# Patient Record
Sex: Female | Born: 2000
Health system: Southern US, Community
[De-identification: ages and names within clinical notes are randomized; demographics above are authoritative.]

## PROBLEM LIST (undated history)

## (undated) DIAGNOSIS — C9100 Acute lymphoblastic leukemia not having achieved remission: Secondary | ICD-10-CM

## (undated) DIAGNOSIS — M1990 Unspecified osteoarthritis, unspecified site: Secondary | ICD-10-CM

## (undated) HISTORY — DX: Acute lymphoblastic leukemia not having achieved remission: C91.00

---
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
## 2022-02-08 ENCOUNTER — Other Ambulatory Visit (HOSPITAL_COMMUNITY): Payer: Self-pay

## 2022-02-12 ENCOUNTER — Other Ambulatory Visit (HOSPITAL_COMMUNITY): Payer: Self-pay

## 2022-02-16 ENCOUNTER — Encounter: Payer: Self-pay | Admitting: Internal Medicine

## 2022-02-23 ENCOUNTER — Ambulatory Visit (INDEPENDENT_AMBULATORY_CARE_PROVIDER_SITE_OTHER): Payer: No Typology Code available for payment source | Admitting: Internal Medicine

## 2022-02-23 ENCOUNTER — Other Ambulatory Visit: Payer: Self-pay

## 2022-02-23 ENCOUNTER — Encounter: Payer: Self-pay | Admitting: Internal Medicine

## 2022-02-23 VITALS — BP 100/60 | HR 80 | Resp 15 | Ht 62.0 in | Wt 167.4 lb

## 2022-02-23 DIAGNOSIS — R21 Rash and other nonspecific skin eruption: Secondary | ICD-10-CM

## 2022-02-23 DIAGNOSIS — L299 Pruritus, unspecified: Secondary | ICD-10-CM

## 2022-02-23 MED ORDER — TRIAMCINOLONE ACETONIDE 0.1 % EX CREA
1.0000 "application " | TOPICAL_CREAM | Freq: Two times a day (BID) | CUTANEOUS | 0 refills | Status: DC
  Filled 2022-02-23: qty 30, 15d supply, fill #0

## 2022-02-23 NOTE — Patient Instructions (Signed)
Rash, Adult  A rash is a change in the color of your skin. A rash can also change the way your skin feels. There are many different conditions and factors that can cause a rash. Follow these instructions at home: The goal of treatment is to stop the itching and keep the rash from spreading. Watch for any changes in your symptoms. Let your doctor know about them. Follow these instructions to help with your condition: Medicine Take or apply over-the-counter and prescription medicines only as told by your doctor. These may include medicines: To treat red or swollen skin (corticosteroid creams). To treat itching. To treat an allergy (oral antihistamines). To treat very bad symptoms (oral corticosteroids).  Skin care Put cool cloths (compresses) on the affected areas. Do not scratch or rub your skin. Avoid covering the rash. Make sure that the rash is exposed to air as much as possible. Managing itching and discomfort Avoid hot showers or baths. These can make itching worse. A cold shower may help. Try taking a bath with: Epsom salts. You can get these at your local pharmacy or grocery store. Follow the instructions on the package. Baking soda. Pour a small amount into the bath as told by your doctor. Colloidal oatmeal. You can get this at your local pharmacy or grocery store. Follow the instructions on the package. Try putting baking soda paste onto your skin. Stir water into baking soda until it gets like a paste. Try putting on a lotion that relieves itchiness (calamine lotion). Keep cool and out of the sun. Sweating and being hot can make itching worse. General instructions  Rest as needed. Drink enough fluid to keep your pee (urine) pale yellow. Wear loose-fitting clothing. Avoid scented soaps, detergents, and perfumes. Use gentle soaps, detergents, perfumes, and other cosmetic products. Avoid anything that causes your rash. Keep a journal to help track what causes your rash. Write  down: What you eat. What cosmetic products you use. What you drink. What you wear. This includes jewelry. Keep all follow-up visits as told by your doctor. This is important. Contact a doctor if: You sweat at night. You lose weight. You pee (urinate) more than normal. You pee less than normal, or you notice that your pee is a darker color than normal. You feel weak. You throw up (vomit). Your skin or the whites of your eyes look yellow (jaundice). Your skin: Tingles. Is numb. Your rash: Does not go away after a few days. Gets worse. You are: More thirsty than normal. More tired than normal. You have: New symptoms. Pain in your belly (abdomen). A fever. Watery poop (diarrhea). Get help right away if: You have a fever and your symptoms suddenly get worse. You start to feel mixed up (confused). You have a very bad headache or a stiff neck. You have very bad joint pains or stiffness. You have jerky movements that you cannot control (seizure). Your rash covers all or most of your body. The rash may or may not be painful. You have blisters that: Are on top of the rash. Grow larger. Grow together. Are painful. Are inside your nose or mouth. You have a rash that: Looks like purple pinprick-sized spots all over your body. Has a "bull's eye" or looks like a target. Is red and painful, causes your skin to peel, and is not from being in the sun too long. Summary A rash is a change in the color of your skin. A rash can also change the way your skin   feels. The goal of treatment is to stop the itching and keep the rash from spreading. Take or apply over-the-counter and prescription medicines only as told by your doctor. Contact a doctor if you have new symptoms or symptoms that get worse. Keep all follow-up visits as told by your doctor. This is important. This information is not intended to replace advice given to you by your health care provider. Make sure you discuss any  questions you have with your health care provider. Document Revised: 07/30/2021 Document Reviewed: 07/30/2021 Elsevier Patient Education  2023 Elsevier Inc.  

## 2022-02-23 NOTE — Progress Notes (Signed)
? ?Subjective:  ? ? Patient ID: Robin Harris, female    DOB: 2001/03/27, 21 y.o.   MRN: [id number] ? ?HPI ? ?Patient presents to clinic today with complaint of a rash on her hands.  This started about 4 months ago.  The rash is located at the base of her palms extending down to her wrist.  The rash is scaly and itchy.  It has not seemed to spread.  She is not allergic to anything that she is aware of.  No one in her home has a similar rash.  She denies recent changes in soaps, lotions, detergents or medications.  She was seen 12/23/2021 for the same, prescribed Prednisone and advised to take Claritin daily.  She reports the Prednisone did not help and she never started taking Claritin.  She called dermatology for an appointment but could not be seen until September. ? ?Review of Systems ? ?   ?Past Medical History:  ?Diagnosis Date  ? 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 semiannually sees Dr. Girtha Rm [facility]), in remission ==> rec yearly CBC, CMP, CPE, and echo Q94yr - [facility]see [facility] dated [facility]  ? Arthritis   ? pt states she does not have arthritis  ? ? ?Current Outpatient Medications  ?Medication Sig Dispense Refill  ? norethindrone-ethinyl estradiol-FE (JUNEL FE 1/20) 1-20 MG-MCG tablet Take 1 tablet by mouth daily. 28 tablet 11  ? predniSONE (DELTASONE) 10 MG tablet Take 6 tabs on day 1, 5 tabs on day 2, 4 tabs on day 3, 3 tabs on day 4, 2 tabs on day 5, 1 tab on day 6 21 tablet 0  ? ?No current facility-administered medications for this visit.  ? ? ?Allergies  ?Allergen Reactions  ? Sulfamethoxazole-Trimethoprim Rash  ? ? ?Family History  ?Problem Relation Age of Onset  ? Diabetes Maternal Grandmother   ? Hyperlipidemia Maternal Grandmother   ? Coronary artery disease Neg Hx   ? Stroke Neg Hx   ? ? ?Social History  ? ?Socioeconomic History  ? Marital status: Single  ?  Spouse name: Not on file  ? Number of children: Not on file  ? Years of education: Not on file  ? Highest  education level: Not on file  ?Occupational History  ? Not on file  ?Tobacco Use  ? Smoking status: Never  ? Smokeless tobacco: Never  ?Vaping Use  ? Vaping Use: Never used  ?Substance and Sexual Activity  ? Alcohol use: No  ? Drug use: No  ? Sexual activity: Never  ?Other Topics Concern  ? Not on file  ?Social History Narrative  ? Lives with mom and sister ([name].  No pets.  ? No smokers at home.  ? ?Social Determinants of Health  ? ?Financial Resource Strain: Not on file  ?Food Insecurity: Not on file  ?Transportation Needs: Not on file  ?Physical Activity: Not on file  ?Stress: Not on file  ?Social Connections: Not on file  ?Intimate Partner Violence: Not on file  ? ? ? ?Constitutional: Denies fever, malaise, fatigue, headache or abrupt weight changes.  ?Respiratory: Denies difficulty breathing, shortness of breath, cough or sputum production.   ?Cardiovascular: Denies chest pain, chest tightness, palpitations or swelling in the hands or feet.  ?Skin: Patient reports rash of hands/wrists.  Denies redness, or ulcercations.  ? ?No other specific complaints in a complete review of systems (except as listed in HPI above). ? ?Objective:  ? Physical Exam ? ? ?  BP 100/60 (BP Location: Left Arm, Patient Position: Sitting, Cuff Size: Normal)   Pulse 80   Resp 15   Ht '5\' 2"'$  (1.575 m)   Wt 167 lb 6.4 oz (75.9 kg)   SpO2 98%   BMI 30.62 kg/m?  ? ?Wt Readings from Last 3 Encounters:  ?12/23/21 163 lb (73.9 kg) (91 %, Z= 1.33)*  ?10/27/21 163 lb (73.9 kg) (91 %, Z= 1.34)*  ?05/21/21 175 lb 12.8 oz (79.7 kg) (95 %, Z= 1.63)*  ? ?* Growth percentiles are based on CDC (Girls, 2-20 Years) data.  ? ? ?General: Appears her stated age, obese, in NAD. ?Skin: Grouped papular, scaly and excoriated lesions noted of the base of the palmar surface of the palms extending down to the mid forearms. ?Cardiovascular: Normal rate and rhythm.  ?Pulmonary/Chest: Normal effort and positive vesicular breath sounds. No respiratory  distress. ?Musculoskeletal: No joint swelling noted of the wrist.  No difficulty with gait.  ?Neurological: Alert and oriented.  ? ? ? ? ? ? ?BMET ?   ?Component Value Date/Time  ? NA 139 10/27/2021 1120  ? K 4.6 10/27/2021 1120  ? CL 104 10/27/2021 1120  ? CO2 28 10/27/2021 1120  ? GLUCOSE 90 10/27/2021 1120  ? BUN 11 10/27/2021 1120  ? CREATININE 0.83 10/27/2021 1120  ? CALCIUM 9.5 10/27/2021 1120  ? ? ?Lipid Panel  ?No results found for: CHOL, TRIG, HDL, CHOLHDL, VLDL, LDLCALC ? ?CBC ?   ?Component Value Date/Time  ? WBC 3.9 (L) 10/27/2021 1120  ? RBC 4.53 10/27/2021 1120  ? HGB 14.5 10/27/2021 1120  ? HCT 43.7 10/27/2021 1120  ? PLT 181 10/27/2021 1120  ? MCV 96.5 10/27/2021 1120  ? MCH 32.0 10/27/2021 1120  ? MCHC 33.2 10/27/2021 1120  ? RDW 12.7 10/27/2021 1120  ? LYMPHSABS 944 (L) 10/27/2021 1120  ? MONOABS 0.5 11/14/2017 1418  ? EOSABS 230 10/27/2021 1120  ? BASOSABS 20 10/27/2021 1120  ? ? ?Hgb A1C ?No results found for: HGBA1C ? ? ? ? ?   ?Assessment & Plan:  ? ?Rash of Hands/Wrists: ? ?Advised her to please start taking the Claritin daily ?Referral to dermatology placed, will consult [place] MD in the meantime ?Referral to allergy placed for allergy testing ?Rx for Triamcinolone cream 0.1% twice daily as needed ? ?Schedule an appointment for your annual exam ?Webb Silversmith, NP ? ?

## 2022-03-21 ENCOUNTER — Ambulatory Visit
Admission: EM | Admit: 2022-03-21 | Discharge: 2022-03-21 | Disposition: A | Discharge status: Home / Self Care | Payer: No Typology Code available for payment source | Attending: Emergency Medicine | Admitting: Emergency Medicine

## 2022-03-21 DIAGNOSIS — J020 Streptococcal pharyngitis: Secondary | ICD-10-CM

## 2022-03-21 LAB — POCT RAPID STREP A (OFFICE): Rapid Strep A Screen: POSITIVE — AB

## 2022-03-21 MED ORDER — AMOXICILLIN 500 MG PO CAPS: 500.0000 mg | ORAL_CAPSULE | Freq: Two times a day (BID) | ORAL | 0 refills | Status: AC

## 2022-03-21 NOTE — ED Triage Notes (Signed)
Patient presents to Urgent Care with complaints of sore throat and nasal congestion x 1 day. Treating symptoms with allergy and tylenol with no improvement.

## 2022-03-21 NOTE — ED Provider Notes (Signed)
Roderic Palau    CSN: [id number] Arrival date & time: 03/21/22  0840      History   Chief Complaint Chief Complaint  Patient presents with   Sore Throat    Sinus Congestion - Entered by patient    HPI Robin Harris is a 21 y.o. female.  Accompanied by her mother, patient presents with sore throat, nasal congestion, postnasal drip x1 day.  No fever, chills, rash, cough, shortness of breath, vomiting, diarrhea, or other symptoms.  Treatment at home with Tylenol and OTC allergy medication; last taken yesterday.  No OTC medications today.  Her medical history includes acute lymphoblastic leukemia.  The history is provided by the patient and medical records.   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 Girtha Rm [facility]), in remission ==> rec yearly CBC, CMP, CPE, and echo Q73yr - [facility]   Arthritis    pt states she does not have arthritis    Patient Active Problem List   Diagnosis Date Noted   History of acute lymphoblastic leukemia (ALL) 10/27/2021    Past Surgical History:  Procedure Laterality Date   MYRINGOTOMY  2005   Bilateral   PORT-A-CATH REMOVAL  2010   PORTACATH PLACEMENT  2007   for chemo   TUMOR REMOVAL  2008   benign; behind left ear    OB History   No obstetric history on file.      Home Medications    Prior to Admission medications   Medication Sig Start Date End Date Taking? Authorizing Provider  amoxicillin (AMOXIL) 500 MG capsule Take 1 capsule (500 mg total) by mouth 2 (two) times daily for 10 days. 03/21/22 03/31/22 Yes TSharion Balloon NP  norethindrone-ethinyl estradiol-FE (JUNEL FE 1/20) 1-20 MG-MCG tablet Take 1 tablet by mouth daily. 10/27/21   BJearld Fenton NP  triamcinolone cream (KENALOG) 0.1 % Apply 1 application. topically 2 (two) times daily. 02/23/22   BJearld Fenton NP  FLUoxetine (PROZAC) 10 MG capsule Take 1  capsule (10 mg total) by mouth daily. 06/11/20 01/23/21  BJearld Fenton NP    Family History Family History  Problem Relation Age of Onset   Diabetes Maternal Grandmother    Hyperlipidemia Maternal Grandmother    Coronary artery disease Neg Hx    Stroke Neg Hx     Social History Social History   Tobacco Use   Smoking status: Never   Smokeless tobacco: Never  Vaping Use   Vaping Use: Never used  Substance Use Topics   Alcohol use: No   Drug use: No     Allergies   Sulfamethoxazole-trimethoprim   Review of Systems Review of Systems  Constitutional:  Negative for chills and fever.  HENT:  Positive for congestion, postnasal drip and sore throat. Negative for ear pain.   Respiratory:  Negative for cough and shortness of breath.   Gastrointestinal:  Negative for diarrhea and vomiting.  Skin:  Negative for color change and rash.  All other systems reviewed and are negative.   Physical Exam Triage Vital Signs ED Triage Vitals  Enc Vitals Group     BP      Pulse      Resp      Temp      Temp src      SpO2      Weight  Height      Head Circumference      Peak Flow      Pain Score      Pain Loc      Pain Edu?      Excl. in [place]?    No data found.  Updated Vital Signs BP 122/83   Pulse 83   Temp 98.2 F (36.8 C)   Resp 18   LMP 02/26/2022   SpO2 100%   Visual Acuity Right Eye Distance:   Left Eye Distance:   Bilateral Distance:    Right Eye Near:   Left Eye Near:    Bilateral Near:     Physical Exam Vitals and nursing note reviewed.  Constitutional:      General: She is not in acute distress.    Appearance: Normal appearance. She is well-developed. She is not ill-appearing.  HENT:     Right Ear: Tympanic membrane normal.     Left Ear: Tympanic membrane normal.     Nose: Nose normal.     Mouth/Throat:     Mouth: Mucous membranes are moist.     Pharynx: Posterior oropharyngeal erythema present.  Cardiovascular:     Rate and Rhythm:  Normal rate and regular rhythm.     Heart sounds: Normal heart sounds.  Pulmonary:     Effort: Pulmonary effort is normal. No respiratory distress.     Breath sounds: Normal breath sounds.  Musculoskeletal:     Cervical back: Neck supple.  Skin:    General: Skin is warm and dry.  Neurological:     Mental Status: She is alert.  Psychiatric:        Mood and Affect: Mood normal.        Behavior: Behavior normal.     UC Treatments / Results  Labs (all labs ordered are listed, but only abnormal results are displayed) Labs Reviewed  POCT RAPID STREP A (OFFICE) - Abnormal; Notable for the following components:      Result Value   Rapid Strep A Screen Positive (*)    All other components within normal limits    EKG   Radiology No results found.  Procedures Procedures (including critical care time)  Medications Ordered in UC Medications - No data to display  Initial Impression / Assessment and Plan / UC Course  I have reviewed the triage vital signs and the nursing notes.  Pertinent labs & imaging results that were available during my care of the patient were reviewed by me and considered in my medical decision making (see chart for details).    Strep pharyngitis.  Rapid strep positive.  Treating with amoxicillin.  Discussed symptomatic treatment including Tylenol or ibuprofen as needed.  Instructed patient to follow-up with her PCP if her symptoms are not improving.  She agrees to plan of care.  Final Clinical Impressions(s) / UC Diagnoses   Final diagnoses:  Strep pharyngitis     Discharge Instructions      Take the amoxicillin as directed for strep throat.   Follow up with your primary care provider if your symptoms are not improving.        ED Prescriptions     Medication Sig Dispense Auth. Provider   amoxicillin (AMOXIL) 500 MG capsule Take 1 capsule (500 mg total) by mouth 2 (two) times daily for 10 days. 20 capsule Sharion Balloon, NP      PDMP not  reviewed this encounter.   Sharion Balloon, NP 03/21/22 [phone]

## 2022-03-21 NOTE — Discharge Instructions (Addendum)
Take the amoxicillin as directed for strep throat.    Follow up with your primary care provider if your symptoms are not improving.    

## 2022-05-17 ENCOUNTER — Other Ambulatory Visit (HOSPITAL_COMMUNITY): Payer: Self-pay

## 2022-05-25 ENCOUNTER — Encounter: Payer: Self-pay | Admitting: Internal Medicine

## 2022-06-16 ENCOUNTER — Other Ambulatory Visit (HOSPITAL_COMMUNITY): Payer: Self-pay

## 2022-07-09 ENCOUNTER — Other Ambulatory Visit (HOSPITAL_COMMUNITY): Payer: Self-pay

## 2022-07-16 ENCOUNTER — Encounter: Payer: Self-pay | Admitting: Internal Medicine

## 2022-07-19 ENCOUNTER — Ambulatory Visit (INDEPENDENT_AMBULATORY_CARE_PROVIDER_SITE_OTHER): Payer: No Typology Code available for payment source | Admitting: Physician Assistant

## 2022-07-19 ENCOUNTER — Encounter: Payer: Self-pay | Admitting: Physician Assistant

## 2022-07-19 VITALS — BP 131/85 | HR 86 | Temp 98.5°F | Ht 62.0 in | Wt 166.4 lb

## 2022-07-19 DIAGNOSIS — J029 Acute pharyngitis, unspecified: Secondary | ICD-10-CM

## 2022-07-19 NOTE — Progress Notes (Signed)
Acute Office Visit   Patient: Robin Harris   DOB: 2001-10-01   21 y.o. Female  MRN: [id number] Visit Date: 07/19/2022  Today's healthcare provider: Dani Gobble Yaser Harvill, PA-C  Introduced myself to the patient as a Journalist, newspaper and provided education on APPs in clinical practice.    Chief Complaint  Patient presents with   Sore Throat   Subjective    Sore Throat  Associated symptoms include congestion. Pertinent negatives include no coughing, diarrhea, ear pain, headaches, shortness of breath or vomiting.     States this started Sat Reports sore throat, stuffy nose with drainage She reports that her mother and sister have been feeling under the weather as well She has not tested herself for COVID at home Interventions: cold and flu and ibruprofen  Medications: Outpatient Medications Prior to Visit  Medication Sig   norethindrone-ethinyl estradiol-FE (JUNEL FE 1/20) 1-20 MG-MCG tablet Take 1 tablet by mouth daily.   triamcinolone cream (KENALOG) 0.1 % Apply 1 application. topically 2 (two) times daily.   No facility-administered medications prior to visit.    Review of Systems  Constitutional:  Positive for fatigue. Negative for chills, diaphoresis and fever.  HENT:  Positive for congestion, sinus pressure and sore throat. Negative for ear pain and rhinorrhea.   Respiratory:  Negative for cough, shortness of breath and wheezing.   Gastrointestinal:  Negative for diarrhea, nausea and vomiting.  Musculoskeletal:  Negative for myalgias.  Neurological:  Negative for dizziness, light-headedness and headaches.       Objective    BP 131/85   Pulse 86   Temp 98.5 F (36.9 C) (Oral)   Ht '5\' 2"'$  (1.575 m)   Wt 166 lb 6.4 oz (75.5 kg)   SpO2 100%   BMI 30.43 kg/m    Physical Exam Vitals reviewed.  Constitutional:      General: She is awake.     Appearance: Normal appearance. She is well-developed, well-groomed and normal weight.  HENT:     Head: Normocephalic and  atraumatic.     Right Ear: Ear canal normal.     Left Ear: Tympanic membrane and ear canal normal.     Nose: No congestion.     Mouth/Throat:     Mouth: Mucous membranes are moist. No oral lesions.     Dentition: Normal dentition.     Pharynx: Uvula midline. Posterior oropharyngeal erythema present. No pharyngeal swelling, oropharyngeal exudate or uvula swelling.     Tonsils: No tonsillar exudate or tonsillar abscesses.     Comments: Mild redness in posterior oropharynx  Eyes:     Conjunctiva/sclera: Conjunctivae normal.     Pupils: Pupils are equal, round, and reactive to light.  Cardiovascular:     Rate and Rhythm: Normal rate and regular rhythm.     Heart sounds: Normal heart sounds. No murmur heard.    No friction rub. No gallop.  Pulmonary:     Effort: Pulmonary effort is normal. No respiratory distress.     Breath sounds: Normal breath sounds. No wheezing, rhonchi or rales.  Musculoskeletal:     Cervical back: Normal range of motion and neck supple. No edema or erythema. Normal range of motion.  Lymphadenopathy:     Head:     Right side of head: No submental, submandibular or preauricular adenopathy.     Left side of head: No submental, submandibular or preauricular adenopathy.     Upper Body:     Right  upper body: No supraclavicular adenopathy.     Left upper body: No supraclavicular adenopathy.  Neurological:     Mental Status: She is alert.  Psychiatric:        Behavior: Behavior is cooperative.       No results found for any visits on 07/19/22.  Assessment & Plan      No follow-ups on file.      Problem List Items Addressed This Visit   None Visit Diagnoses     Sore throat    -  Primary Acute, new concern Reports sore throat, nasal congestion, maxillary sinus pressure Rapid strep was negative - Centor criteria score of 1 so will not send for culture at this time Recommend symptomatic relief as mainstay of management- can use lozenges, throat numbing  spray, ibuprofen, tylenol as desired Offered to test for COVID, flu, RSV but patient declined Follow up as needed - discussed return precautions and she voiced understanding         No follow-ups on file.   I, Mishika Flippen E Mosetta Ferdinand, PA-C, have reviewed all documentation for this visit. The documentation on 07/19/22 for the exam, diagnosis, procedures, and orders are all accurate and complete.   Talitha Givens, MHS, PA-C [facility]

## 2022-07-19 NOTE — Progress Notes (Deleted)
          Acute Office Visit   Patient: Robin Harris   DOB: May 10, 2001   21 y.o. Female  MRN: [id number] Visit Date: 07/19/2022  Today's healthcare provider: Dani Gobble Calianne Larue, PA-C  Introduced myself to the patient as a Journalist, newspaper and provided education on APPs in clinical practice.    Chief Complaint  Patient presents with   Sore Throat   Subjective    HPI    Medications: Outpatient Medications Prior to Visit  Medication Sig   norethindrone-ethinyl estradiol-FE (JUNEL FE 1/20) 1-20 MG-MCG tablet Take 1 tablet by mouth daily.   triamcinolone cream (KENALOG) 0.1 % Apply 1 application. topically 2 (two) times daily.   No facility-administered medications prior to visit.    Review of Systems  {Labs  Heme  Chem  Endocrine  Serology  Results Review (optional):23779}   Objective    BP 131/85   Pulse 86   Temp 98.5 F (36.9 C) (Oral)   Ht '5\' 2"'$  (1.575 m)   Wt 166 lb 6.4 oz (75.5 kg)   SpO2 100%   BMI 30.43 kg/m  {Show previous vital signs (optional):23777}  Physical Exam    No results found for any visits on 07/19/22.  Assessment & Plan      No follow-ups on file.

## 2022-07-26 ENCOUNTER — Other Ambulatory Visit: Payer: Self-pay

## 2022-07-26 ENCOUNTER — Telehealth: Payer: No Typology Code available for payment source | Admitting: Physician Assistant

## 2022-07-26 DIAGNOSIS — L0100 Impetigo, unspecified: Secondary | ICD-10-CM | POA: Diagnosis not present

## 2022-07-26 MED ORDER — MUPIROCIN 2 % EX OINT
1.0000 | TOPICAL_OINTMENT | Freq: Two times a day (BID) | CUTANEOUS | 0 refills | Status: DC
  Filled 2022-07-26: qty 22, 10d supply, fill #0

## 2022-07-26 NOTE — Progress Notes (Signed)
E Visit for Rash  We are sorry that you are not feeling well. Here is how we plan to help!  Based upon what you have shared with me it looks like you have a bacterial infection called Impetigo. Impetigo is a contagious skin infection that usually affects children between the ages of two and five years, but it can also affect older children and adults. It can develop if bacteria get into healthy skin or into minor cuts, scrapes, or any other small openings (such as those caused by bug bites). The infection usually occurs in warm, humid conditions and is easily spread among people in close contact, particularly in crowded living conditions.  Impetigo is usually caused by a bacterium called "Staphylococcus aureus," a type of "staph" infection. Less commonly, the infection is caused by either another bacterium called streptococcus group A, or "strep," or by both bacteria.   I have prescribed: and Topical mupirocin Apply to affected area twice daily for 7 days.  HOME CARE:  Take cool showers and avoid direct sunlight. Apply cool compress or wet dressings. Take a bath in an oatmeal bath.  Sprinkle content of one Aveeno packet under running faucet with comfortably warm water.  Bathe for 15-20 minutes, 1-2 times daily.  Pat dry with a towel. Do not rub the rash. Use hydrocortisone cream. Take an antihistamine like Benadryl for widespread rashes that itch.  The adult dose of Benadryl is 25-50 mg by mouth 4 times daily. Caution:  This type of medication may cause sleepiness.  Do not drink alcohol, drive, or operate dangerous machinery while taking antihistamines.  Do not take these medications if you have prostate enlargement.  Read package instructions thoroughly on all medications that you take.  GET HELP RIGHT AWAY IF:  Symptoms don't go away after treatment. Severe itching that persists. If you rash spreads or swells. If you rash begins to smell. If it blisters and opens or develops a yellow-brown  crust. You develop a fever. You have a sore throat. You become short of breath.  MAKE SURE YOU:  Understand these instructions. Will watch your condition. Will get help right away if you are not doing well or get worse.  Thank you for choosing an e-visit.  Your e-visit answers were reviewed by a board certified advanced clinical practitioner to complete your personal care plan. Depending upon the condition, your plan could have included both over the counter or prescription medications.  Please review your pharmacy choice. Make sure the pharmacy is open so you can pick up prescription now. If there is a problem, you may contact your provider through [employer] and have the prescription routed to another pharmacy.  Your safety is important to [place]. If you have drug allergies check your prescription carefully.   For the next 24 hours you can use MyChart to ask questions about today's visit, request a non-urgent call back, or ask for a work or school excuse. You will get an email in the next two days asking about your experience. I hope that your e-visit has been valuable and will speed your recovery.  I provided 5 minutes of non face-to-face time during this encounter for chart review and documentation.

## 2022-08-09 ENCOUNTER — Other Ambulatory Visit (HOSPITAL_COMMUNITY): Payer: Self-pay

## 2022-08-11 ENCOUNTER — Ambulatory Visit: Payer: No Typology Code available for payment source | Admitting: Dermatology

## 2022-08-11 ENCOUNTER — Other Ambulatory Visit: Payer: Self-pay

## 2022-08-11 DIAGNOSIS — L509 Urticaria, unspecified: Secondary | ICD-10-CM

## 2022-08-11 DIAGNOSIS — L858 Other specified epidermal thickening: Secondary | ICD-10-CM

## 2022-08-11 DIAGNOSIS — L209 Atopic dermatitis, unspecified: Secondary | ICD-10-CM

## 2022-08-11 MED ORDER — CLOBETASOL PROPIONATE 0.05 % EX CREA
1.0000 | TOPICAL_CREAM | CUTANEOUS | 2 refills | Status: AC
  Filled 2022-08-11: qty 60, 30d supply, fill #0
  Filled 2023-08-08: qty 60, 30d supply, fill #1

## 2022-08-11 NOTE — Patient Instructions (Addendum)
For Hives Start Claritin or Allegra 1 to 4 daily as needed for hives.  Start out with one a day,  then if not improving may take 2 a day, then if not improving on 2 may take 3 a day, then if still not improving amy take up to 4 a day as needed for hives    Due to recent changes in healthcare laws, you may see results of your pathology and/or laboratory studies on MyChart before the doctors have had a chance to review them. We understand that in some cases there may be results that are confusing or concerning to you. Please understand that not all results are received at the same time and often the doctors may need to interpret multiple results in order to provide you with the best plan of care or course of treatment. Therefore, we ask that you please give [place] 2 business days to thoroughly review all your results before contacting the office for clarification. Should we see a critical lab result, you will be contacted sooner.   If You Need Anything After Your Visit  If you have any questions or concerns for your doctor, please call our main line at [phone] and press option 4 to reach your doctor's medical assistant. If no one answers, please leave a voicemail as directed and we will return your call as soon as possible. Messages left after 4 pm will be answered the following business day.   You may also send [place] a message via [place]. We typically respond to MyChart messages within 1-2 business days.  For prescription refills, please ask your pharmacy to contact our office. Our fax number is [phone].  If you have an urgent issue when the clinic is closed that cannot wait until the next business day, you can page your doctor at the number below.    Please note that while we do our best to be available for urgent issues outside of office hours, we are not available 24/7.   If you have an urgent issue and are unable to reach [place], you may choose to seek medical care at your doctor's office,  retail clinic, urgent care center, or emergency room.  If you have a medical emergency, please immediately call 911 or go to the emergency department.  Pager Numbers  - Dr. Nehemiah Massed: [phone]  - Dr. Laurence Ferrari: [phone]  - Dr. Nicole Kindred: [phone]  In the event of inclement weather, please call our main line at [phone] for an update on the status of any delays or closures.  Dermatology Medication Tips: Please keep the boxes that topical medications come in in order to help keep track of the instructions about where and how to use these. Pharmacies typically print the medication instructions only on the boxes and not directly on the medication tubes.   If your medication is too expensive, please contact our office at [phone] option 4 or send [place] a message through [place].   We are unable to tell what your co-pay for medications will be in advance as this is different depending on your insurance coverage. However, we may be able to find a substitute medication at lower cost or fill out paperwork to get insurance to cover a needed medication.   If a prior authorization is required to get your medication covered by your insurance company, please allow [place] 1-2 business days to complete this process.  Drug prices often vary depending on where the prescription is filled and some pharmacies may offer cheaper prices.  The website www.goodrx.com contains coupons for medications through different pharmacies. The prices here do not account for what the cost may be with help from insurance (it may be cheaper with your insurance), but the website can give you the price if you did not use any insurance.  - You can print the associated coupon and take it with your prescription to the pharmacy.  - You may also stop by our office during regular business hours and pick up a GoodRx coupon card.  - If you need your prescription sent electronically to a different pharmacy, notify our office through  [facility] or by phone at [phone] option 4.     Si Usted Necesita Algo Despus de Su Visita  Tambin puede enviarnos un mensaje a travs de Pharmacist, community. Por lo general respondemos a los mensajes de MyChart en el transcurso de 1 a 2 das hbiles.  Para renovar recetas, por favor pida a su farmacia que se ponga en contacto con nuestra oficina. Harland Dingwall de fax es [place] [phone].  Si tiene un asunto urgente cuando la clnica est cerrada y que no puede esperar hasta el siguiente da hbil, puede llamar/localizar a su doctor(a) al nmero que aparece a continuacin.   Por favor, tenga en cuenta que aunque hacemos todo lo posible para estar disponibles para asuntos urgentes fuera del horario de [place], no estamos disponibles las 24 horas del da, los 7 das de la [place].   Si tiene un problema urgente y no puede comunicarse con nosotros, puede optar por buscar atencin mdica  en el consultorio de su doctor(a), en una clnica privada, en un centro de atencin urgente o en una sala de emergencias.  Si tiene Engineering geologist, por favor llame inmediatamente al 911 o vaya a la sala de emergencias.  Nmeros de bper  - Dr. Nehemiah Massed: [phone]  - Dra. Moye: [phone]  - Dra. Nicole Kindred: [phone]  En caso de inclemencias del [place], por favor llame a Johnsie Kindred principal al [phone] para una actualizacin sobre el [place] de cualquier retraso o cierre.  Consejos para la medicacin en dermatologa: Por favor, guarde las cajas en las que vienen los medicamentos de uso tpico para ayudarle a seguir las instrucciones sobre dnde y cmo usarlos. Las farmacias generalmente imprimen las instrucciones del medicamento slo en las cajas y no directamente en los tubos del [place].   Si su medicamento es muy caro, por favor, pngase en contacto con Zigmund Daniel llamando al [phone] y presione la opcin 4 o envenos un mensaje a travs de Pharmacist, community.   No podemos  decirle cul ser su copago por los medicamentos por adelantado ya que esto es diferente dependiendo de la cobertura de su seguro. Sin embargo, es posible que podamos encontrar un medicamento sustituto a Electrical engineer un formulario para que el seguro cubra el medicamento que se considera necesario.   Si se requiere una autorizacin previa para que su compaa de seguros [place] su medicamento, por favor permtanos de 1 a 2 das hbiles para completar este proceso.  Los precios de los medicamentos varan con frecuencia dependiendo del Environmental consultant de dnde se surte la receta y alguna farmacias pueden ofrecer precios ms baratos.  El sitio web www.goodrx.com tiene cupones para medicamentos de Airline pilot. Los precios aqu no tienen en cuenta lo que podra costar con la ayuda del seguro (puede ser ms barato con su seguro), pero el sitio web puede darle el precio si no utiliz Research scientist (physical sciences).  -  Puede imprimir el cupn correspondiente y llevarlo con su receta a la farmacia.  - Tambin puede pasar por nuestra oficina durante el horario de atencin regular y Charity fundraiser una tarjeta de cupones de GoodRx.  - Si necesita que su receta se enve electrnicamente a una farmacia diferente, informe a nuestra oficina a travs de MyChart de [place] o por telfono llamando al [phone] y presione la opcin 4.

## 2022-08-11 NOTE — Progress Notes (Signed)
New Patient Visit  Subjective  Robin Harris is a 21 y.o. female who presents for the following: hx of rash (Hands, 53m cleared, TMC 0.1% cr bid when flared, pt said hx of bumps with clear fluid in them on hands) and hx of hives (Body, come and go for 2 or more years, antihistamine prn itching).  New patient referral from RWebb Silversmith NP.  The following portions of the chart were reviewed this encounter and updated as appropriate:   Tobacco  Allergies  Meds  Problems  Med Hx  Surg Hx  Fam Hx     Review of Systems:  No other skin or systemic complaints except as noted in HPI or Assessment and Plan.  Objective  Well appearing patient in no apparent distress; mood and affect are within normal limits.  A focused examination was performed including hands, arms. Relevant physical exam findings are noted in the Assessment and Plan.  hands, arms Hands clear today,  Photos viewed  trunk, extremities Arms, legs clear today Dermatographism on left arm   Assessment & Plan   Keratosis Pilaris - Tiny follicular keratotic papules - Benign. Genetic in nature. No cure. - Observe. - If desired, patient can use an emollient (moisturizer) containing ammonium lactate, urea or salicylic acid once a day to smooth the area  -arms  Atopic dermatitis, /Dyshidrotic hand dermatitis hands, arms Chronic and persistent condition with duration or expected duration over one year. Condition is symptomatic / bothersome to patient. Not to goal. Atopic dermatitis (eczema) is a chronic, relapsing, pruritic condition that can significantly affect quality of life. It is often associated with allergic rhinitis and/or asthma and can require treatment with topical medications, phototherapy, or in severe cases biologic injectable medication (Dupixent; Adbry) or Oral JAK inhibitors.   Start Clobetasol cr qd up to 5d/wk to aa hands and arms only prn flares, avoid f/g/a D/c TMC 0.1% cr Start mild soap and  moisturizer qd  Discussed Dupixent, Adbry if worsens or flares become more frequent  Topical steroids (such as triamcinolone, fluocinolone, fluocinonide, mometasone, clobetasol, halobetasol, betamethasone, hydrocortisone) can cause thinning and lightening of the skin if they are used for too long in the same area. Your physician has selected the right strength medicine for your problem and area affected on the body. Please use your medication only as directed by your physician to prevent side effects.    clobetasol cream (TEMOVATE) 0.05 % - hands, arms Apply to affected area as directed daily up to 5 days a week to eczema on hands and arms as needed for flares, avoid face, groin, axilla  Urticaria trunk, extremities Hx of Urticaria  Urticaria or hives is a pink to red patchy whelp- like rash of the skin that typically itches and it is the result of histamine release in the skin.   Hives may have multiple causes including stress, medications, infections, and systemic illness.  Sometimes there is a family history of chronic urticaria.   "Physical urticarias" may be caused by heat, sun, cold, vibration.   Insect bites can cause "papular urticaria". It is often difficult to find the cause of generalized hives.  Statistically, 70% of the time a cause of generalized hives is not found.  Sometimes hives can spontaneously resolve. Other times hives can persist and when it does, and no cause is found, and it has been at least 6 weeks since started, it is called "chronic idiopathic urticaria".  Recommend Allegra or Claritin 1-4 po qd  Return in about 5 months (around 01/10/2023) for Hand eczema, hives.  I, Othelia Pulling, RMA, am acting as scribe for Sarina Ser, MD . Documentation: I have reviewed the above documentation for accuracy and completeness, and I agree with the above.  Sarina Ser, MD

## 2022-08-17 ENCOUNTER — Encounter: Payer: Self-pay | Admitting: Dermatology

## 2022-08-31 ENCOUNTER — Other Ambulatory Visit: Payer: Self-pay | Admitting: Internal Medicine

## 2022-08-31 ENCOUNTER — Other Ambulatory Visit: Payer: Self-pay

## 2022-08-31 MED ORDER — AZITHROMYCIN 250 MG PO TABS
ORAL_TABLET | ORAL | 0 refills | Status: DC
  Filled 2022-08-31 (×2): qty 6, 5d supply, fill #0

## 2022-08-31 MED ORDER — AZITHROMYCIN 250 MG PO TABS: ORAL_TABLET | ORAL | 0 refills | Status: DC

## 2022-09-06 ENCOUNTER — Other Ambulatory Visit (HOSPITAL_COMMUNITY): Payer: Self-pay

## 2022-09-16 ENCOUNTER — Encounter: Payer: Self-pay | Admitting: Internal Medicine

## 2022-09-16 ENCOUNTER — Ambulatory Visit (INDEPENDENT_AMBULATORY_CARE_PROVIDER_SITE_OTHER): Payer: No Typology Code available for payment source | Admitting: Internal Medicine

## 2022-09-16 ENCOUNTER — Other Ambulatory Visit: Payer: Self-pay

## 2022-09-16 VITALS — BP 136/81 | HR 97 | Temp 97.5°F | Wt 163.0 lb

## 2022-09-16 DIAGNOSIS — E663 Overweight: Secondary | ICD-10-CM

## 2022-09-16 DIAGNOSIS — R7309 Other abnormal glucose: Secondary | ICD-10-CM

## 2022-09-16 DIAGNOSIS — Z1159 Encounter for screening for other viral diseases: Secondary | ICD-10-CM | POA: Diagnosis not present

## 2022-09-16 DIAGNOSIS — Z6829 Body mass index (BMI) 29.0-29.9, adult: Secondary | ICD-10-CM | POA: Insufficient documentation

## 2022-09-16 DIAGNOSIS — Z0001 Encounter for general adult medical examination with abnormal findings: Secondary | ICD-10-CM | POA: Diagnosis not present

## 2022-09-16 DIAGNOSIS — E6609 Other obesity due to excess calories: Secondary | ICD-10-CM | POA: Insufficient documentation

## 2022-09-16 DIAGNOSIS — Z114 Encounter for screening for human immunodeficiency virus [HIV]: Secondary | ICD-10-CM

## 2022-09-16 MED ORDER — NORGESTIM-ETH ESTRAD TRIPHASIC 0.18/0.215/0.25 MG-25 MCG PO TABS
1.0000 | ORAL_TABLET | Freq: Every day | ORAL | 11 refills | Status: DC
  Filled 2022-09-16: qty 28, 28d supply, fill #0
  Filled 2022-10-10: qty 28, 28d supply, fill #1
  Filled 2022-11-09: qty 28, 28d supply, fill #2
  Filled 2022-12-05: qty 28, 28d supply, fill #3
  Filled 2022-12-31: qty 28, 28d supply, fill #4
  Filled 2023-01-30: qty 28, 28d supply, fill #5
  Filled 2023-03-03: qty 28, 28d supply, fill #6
  Filled 2023-03-21: qty 28, 28d supply, fill #7
  Filled 2023-03-25: qty 84, 84d supply, fill #0
  Filled 2023-06-16 (×2): qty 56, 56d supply, fill #1

## 2022-09-16 NOTE — Progress Notes (Signed)
Subjective:    Patient ID: Robin Harris, female    DOB: 04-26-2001, 21 y.o.   MRN: [id number]  HPI  Patient presents to clinic today for her annual exam.  Flu: 08/2022 Tetanus: 05/2015 COVID: never Dentist: biannually  Diet: She does eat meat. She consumes fruits and veggies. She does eat some fried foods. She drinks mostly water and tea. 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 Girtha [facility]), in remission ==> rec yearly CBC, CMP, CPE, and echo Q28yr - [facility] (see note dated 10/2012)   Arthritis    pt states she does not have arthritis    Current Outpatient Medications  Medication Sig Dispense Refill   azithromycin (ZITHROMAX) 250 MG tablet Take 2 tablets by mouth today, then 1 tablet by mouth daily for 4 days 6 tablet 0   clobetasol cream (TEMOVATE) 0.05 % Apply to affected area as directed daily up to 5 days a week to eczema on hands and arms as needed for flares, avoid face, groin, axilla 60 g 2   mupirocin ointment (BACTROBAN) 2 % Apply 1 Application topically 2 (two) times daily. X 7 days 22 g 0   norethindrone-ethinyl estradiol-FE (JUNEL FE 1/20) 1-20 MG-MCG tablet Take 1 tablet by mouth daily. 28 tablet 11   triamcinolone cream (KENALOG) 0.1 % Apply 1 application. topically 2 (two) times daily. 30 g 0   No current facility-administered medications for this visit.    Allergies  Allergen Reactions   Sulfamethoxazole-Trimethoprim Rash    Family History  Problem Relation Age of Onset   Diabetes Maternal Grandmother    Hyperlipidemia Maternal Grandmother    Coronary artery disease Neg Hx    Stroke Neg Hx     Social History   Socioeconomic History   Marital status: Single    Spouse name: Not on file   Number of children: Not on file   Years of education: Not on file   Highest education level: Not on file  Occupational History    Not on file  Tobacco Use   Smoking status: Never   Smokeless tobacco: Never  Vaping Use   Vaping Use: Never used  Substance and Sexual Activity   Alcohol use: No   Drug use: No   Sexual activity: Never  Other Topics Concern   Not on file  Social History Narrative   Lives with mom and sister ([name].  No pets.   No smokers at home.   Social Determinants of Health   Financial Resource Strain: Not on file  Food Insecurity: Not on file  Transportation Needs: Not on file  Physical Activity: Not on file  Stress: Not on file  Social Connections: Not on file  Intimate Partner Violence: Not on file     Constitutional: Denies fever, malaise, fatigue, headache or abrupt weight changes.  HEENT: Denies eye pain, eye redness, ear pain, ringing in the ears, wax buildup, runny nose, nasal congestion, bloody nose, or sore throat. Respiratory: Denies difficulty breathing, shortness of breath, cough or sputum production.   Cardiovascular: Denies chest pain, chest tightness, palpitations or swelling in the hands or feet.  Gastrointestinal: Denies abdominal pain, bloating, constipation, diarrhea or blood in the stool.  GU: Denies urgency, frequency, pain with urination, burning sensation, blood in urine, odor or discharge. Musculoskeletal: Denies decrease in range of motion, difficulty  with gait, muscle pain or joint pain and swelling.  Skin: Denies redness, rashes, lesions or ulcercations.  Neurological: Denies dizziness, difficulty with memory, difficulty with speech or problems with balance and coordination.  Psych: Pt reports anxiety and depression. Denies SI/HI.  No other specific complaints in a complete review of systems (except as listed in HPI above).  Objective:   Physical Exam   BP 136/81 (BP Location: Right Arm, Patient Position: Sitting, Cuff Size: Normal)   Pulse 97   Temp (!) 97.5 F (36.4 C) (Temporal)   Wt 163 lb (73.9 kg)   SpO2 99%   BMI 29.81 kg/m   Wt Readings  from Last 3 Encounters:  07/19/22 166 lb 6.4 oz (75.5 kg) (91 %, Z= 1.37)*  02/23/22 167 lb 6.4 oz (75.9 kg) (92 %, Z= 1.42)*  12/23/21 163 lb (73.9 kg) (91 %, Z= 1.33)*   * Growth percentiles are based on CDC (Girls, 2-20 Years) data.    General: Appears her stated age, overweight, in NAD. Skin: Warm, dry and intact. Lipoma noted anterior to right lateral malloelus. HEENT: Head: normal shape and size; Eyes: sclera white, no icterus, conjunctiva pink, PERRLA and EOMs intact;  Neck:  Neck supple, trachea midline. No masses, lumps or thyromegaly present.  Cardiovascular: Normal rate and rhythm. S1,S2 noted.  No murmur, rubs or gallops noted. No JVD or BLE edema.  Pulmonary/Chest: Normal effort and positive vesicular breath sounds. No respiratory distress. No wheezes, rales or ronchi noted.  Abdomen:  Normal bowel sounds.  Musculoskeletal: Strength 5/5 BUE/BLE. No difficulty with gait.  Neurological: Alert and oriented. Cranial nerves II-XII grossly intact. Coordination normal.  Psychiatric: Mood and affect normal. Behavior is normal. Judgment and thought content normal.    BMET    Component Value Date/Time   NA 139 10/27/2021 1120   K 4.6 10/27/2021 1120   CL 104 10/27/2021 1120   CO2 28 10/27/2021 1120   GLUCOSE 90 10/27/2021 1120   BUN 11 10/27/2021 1120   CREATININE 0.83 10/27/2021 1120   CALCIUM 9.5 10/27/2021 1120    Lipid Panel  No results found for: "CHOL", "TRIG", "HDL", "CHOLHDL", "VLDL", "LDLCALC"  CBC    Component Value Date/Time   WBC 3.9 (L) 10/27/2021 1120   RBC 4.53 10/27/2021 1120   HGB 14.5 10/27/2021 1120   HCT 43.7 10/27/2021 1120   PLT 181 10/27/2021 1120   MCV 96.5 10/27/2021 1120   MCH 32.0 10/27/2021 1120   MCHC 33.2 10/27/2021 1120   RDW 12.7 10/27/2021 1120   LYMPHSABS 944 (L) 10/27/2021 1120   MONOABS 0.5 11/14/2017 1418   EOSABS 230 10/27/2021 1120   BASOSABS 20 10/27/2021 1120    Hgb A1C No results found for: "HGBA1C"          Assessment & Plan:   Preventative Health Maintenance:  Flu shot UTD Tetanus UTD Encouraged her to get her COVID-vaccine Encouraged her to consume a balanced diet and exercise regimen Advised her to see a dentist annually Will check CBC, CMET, TSH, Lipid, A1C, HIV and Hep C today  RTC in 1 year, sooner if needed Webb Silversmith, NP

## 2022-09-16 NOTE — Patient Instructions (Signed)

## 2022-09-16 NOTE — Assessment & Plan Note (Signed)
Encouraged diet and exercise for weight loss ?

## 2022-09-17 LAB — COMPLETE METABOLIC PANEL WITH GFR
AG Ratio: 1.6 (calc) (ref 1.0–2.5)
ALT: 10 U/L (ref 5–32)
AST: 13 U/L (ref 12–32)
Albumin: 4.3 g/dL (ref 3.6–5.1)
Alkaline phosphatase (APISO): 52 U/L (ref 36–128)
BUN/Creatinine Ratio: 9 (calc) (ref 6–22)
BUN: 9 mg/dL (ref 7–20)
CO2: 25 mmol/L (ref 20–32)
Calcium: 9.1 mg/dL (ref 8.9–10.4)
Chloride: 110 mmol/L (ref 98–110)
Creat: 1.05 mg/dL — ABNORMAL HIGH (ref 0.50–0.96)
Globulin: 2.7 g/dL (calc) (ref 2.0–3.8)
Glucose, Bld: 93 mg/dL (ref 65–99)
Potassium: 4.3 mmol/L (ref 3.8–5.1)
Sodium: 142 mmol/L (ref 135–146)
Total Bilirubin: 0.4 mg/dL (ref 0.2–1.1)
Total Protein: 7 g/dL (ref 6.3–8.2)
eGFR: 79 mL/min/{1.73_m2} (ref >=60)

## 2022-09-17 LAB — LIPID PANEL
Cholesterol: 208 mg/dL — ABNORMAL HIGH (ref <170)
HDL: 42 mg/dL — ABNORMAL LOW (ref >45)
LDL Cholesterol (Calc): 147 mg/dL (calc) — ABNORMAL HIGH (ref <110)
Non-HDL Cholesterol (Calc): 166 mg/dL (calc) — ABNORMAL HIGH (ref <120)
Total CHOL/HDL Ratio: 5 (calc) — ABNORMAL HIGH (ref <5.0)
Triglycerides: 87 mg/dL (ref <90)

## 2022-09-17 LAB — HIV ANTIBODY (ROUTINE TESTING W REFLEX): HIV 1&2 Ab, 4th Generation: NONREACTIVE

## 2022-09-17 LAB — TSH: TSH: 0.89 mIU/L

## 2022-09-17 LAB — CBC
HCT: 41.3 % (ref 34.0–46.0)
Hemoglobin: 14.2 g/dL (ref 11.5–15.3)
MCH: 32.6 pg (ref 25.0–35.0)
MCHC: 34.4 g/dL (ref 31.0–36.0)
MCV: 94.9 fL (ref 78.0–98.0)
MPV: 12.1 fL (ref 7.5–12.5)
Platelets: 204 10*3/uL (ref 140–400)
RBC: 4.35 10*6/uL (ref 3.80–5.10)
RDW: 11.8 % (ref 11.0–15.0)
WBC: 4.7 10*3/uL (ref 4.5–13.0)

## 2022-09-17 LAB — HEMOGLOBIN A1C
Hgb A1c MFr Bld: 5.1 % of total Hgb (ref <5.7)
Mean Plasma Glucose: 100 mg/dL
eAG (mmol/L): 5.5 mmol/L

## 2022-09-17 LAB — HEPATITIS C ANTIBODY: Hepatitis C Ab: NONREACTIVE

## 2022-09-28 ENCOUNTER — Ambulatory Visit: Payer: No Typology Code available for payment source | Admitting: Podiatry

## 2022-09-28 ENCOUNTER — Ambulatory Visit (INDEPENDENT_AMBULATORY_CARE_PROVIDER_SITE_OTHER): Payer: No Typology Code available for payment source

## 2022-09-28 DIAGNOSIS — M79671 Pain in right foot: Secondary | ICD-10-CM

## 2022-09-28 DIAGNOSIS — M7989 Other specified soft tissue disorders: Secondary | ICD-10-CM | POA: Diagnosis not present

## 2022-09-28 DIAGNOSIS — M25371 Other instability, right ankle: Secondary | ICD-10-CM

## 2022-09-28 DIAGNOSIS — S93409A Sprain of unspecified ligament of unspecified ankle, initial encounter: Secondary | ICD-10-CM | POA: Diagnosis not present

## 2022-09-28 NOTE — Progress Notes (Unsigned)
Subjective:   Patient ID: Robin Harris, female   DOB: 21 y.o.   MRN: [id number]   HPI Chief Complaint  Patient presents with   soft tissue mass    Right ankle soft tissue mass, started 3 months ago, patient rate of pain 4 out of 10, Aches X-rays done today    She injured it a few years ago and over hte last few months it has been huring more. When it swells it hurts more. When she injrued it orgically she was in a boot and she did PT.   She injuried it origically playing basketball and since he has had repeated injuries. Ow she feels unstable.    ROS      Objective:  Physical Exam  ***     Assessment:  ***     Plan:  ***

## 2022-09-28 NOTE — Patient Instructions (Signed)
  I have ordered a MRI of the right ankle. If you do not hear for them about scheduling within the next 1 week, or you have any questions please give [place] a call at [phone].

## 2022-10-01 ENCOUNTER — Other Ambulatory Visit: Payer: Self-pay | Admitting: Podiatry

## 2022-10-01 DIAGNOSIS — M7989 Other specified soft tissue disorders: Secondary | ICD-10-CM

## 2022-10-01 DIAGNOSIS — S93409A Sprain of unspecified ligament of unspecified ankle, initial encounter: Secondary | ICD-10-CM

## 2022-10-01 DIAGNOSIS — M25371 Other instability, right ankle: Secondary | ICD-10-CM

## 2022-10-01 DIAGNOSIS — M79671 Pain in right foot: Secondary | ICD-10-CM

## 2022-10-05 ENCOUNTER — Telehealth: Payer: Self-pay | Admitting: *Deleted

## 2022-10-05 NOTE — Telephone Encounter (Signed)
Spoke with [place] [facility] and she said that even though the notes from yesterday mentioned conservative treatment listed, could not find anything thing such as injections. Looking at the denial letter previously, will cause the order to be denied again. She will need injections, where done, date ,other treatment options.

## 2022-10-05 NOTE — Telephone Encounter (Signed)
See denial reason

## 2022-10-07 ENCOUNTER — Telehealth: Payer: Self-pay | Admitting: *Deleted

## 2022-10-07 NOTE — Telephone Encounter (Signed)
Spoke to the patient and told her that I was going to send her MRI order to [facility] in [place] so she can get everything done there including her PA. Patient understood and agreed.

## 2022-10-07 NOTE — Telephone Encounter (Signed)
Patient called and said that she reached out to her insurance company and they did not have any record of the authorization.

## 2022-10-07 NOTE — Telephone Encounter (Signed)
Patient is calling for the status of MRI. Contacted [facility] and the order is in their work que ,still pending from [facility].  Called patient to update, no answer, could not leave a voice message.

## 2022-10-07 NOTE — Addendum Note (Signed)
Addended by: Blair Heys L on: 10/07/2022 04:35 PM   Modules accepted: Orders

## 2022-10-07 NOTE — Telephone Encounter (Signed)
Patient called back and was updated

## 2022-10-11 ENCOUNTER — Other Ambulatory Visit (HOSPITAL_COMMUNITY): Payer: Self-pay

## 2022-10-20 ENCOUNTER — Ambulatory Visit
Admission: RE | Admit: 2022-10-20 | Discharge: 2022-10-20 | Disposition: A | Discharge status: Home / Self Care | Payer: No Typology Code available for payment source | Source: Ambulatory Visit | Attending: Podiatry | Admitting: Podiatry

## 2022-10-20 DIAGNOSIS — M25371 Other instability, right ankle: Secondary | ICD-10-CM

## 2022-10-20 DIAGNOSIS — M7989 Other specified soft tissue disorders: Secondary | ICD-10-CM

## 2022-11-04 ENCOUNTER — Ambulatory Visit (INDEPENDENT_AMBULATORY_CARE_PROVIDER_SITE_OTHER): Payer: 59 | Admitting: Podiatry

## 2022-11-04 VITALS — BP 123/62 | HR 69

## 2022-11-04 DIAGNOSIS — S93409A Sprain of unspecified ligament of unspecified ankle, initial encounter: Secondary | ICD-10-CM

## 2022-11-04 DIAGNOSIS — M25371 Other instability, right ankle: Secondary | ICD-10-CM

## 2022-11-04 NOTE — Patient Instructions (Signed)
Pre-Operative Instructions  Congratulations, you have decided to take an important step to improving your quality of life.  You can be assured that the doctors of [facility] will be with you every step of the way.  Plan to be at the surgery center/hospital at least 1 (one) hour prior to your scheduled time unless otherwise directed by the surgical center/hospital staff.  You must have a responsible adult accompany you, remain during the surgery and drive you home.  Make sure you have directions to the surgical center/hospital and know how to get there on time. For hospital based surgery you will need to obtain a history and physical form from your family physician within 1 month prior to the date of surgery- we will give you a form for you primary physician.  We make every effort to accommodate the date you request for surgery.  There are however, times where surgery dates or times have to be moved.  We will contact you as soon as possible if a change in schedule is required.   No Aspirin/Ibuprofen for one week before surgery.  If you are on aspirin, any non-steroidal anti-inflammatory medications (Mobic, Aleve, Ibuprofen) you should stop taking it 7 days prior to your surgery.  You make take Tylenol  For pain prior to surgery.  Medications- If you are taking daily heart and blood pressure medications, seizure, reflux, allergy, asthma, anxiety, pain or diabetes medications, make sure the surgery center/hospital is aware before the day of surgery so they may notify you which medications to take or avoid the day of surgery. No food or drink after midnight the night before surgery unless directed otherwise by surgical center/hospital staff. No alcoholic beverages 24 hours prior to surgery.  No smoking 24 hours prior to or 24 hours after surgery. Wear loose pants or shorts- loose enough to fit over bandages, boots, and casts. No slip on shoes, sneakers are best. Bring your boot with you to the  surgery center/hospital.  Also bring crutches or a walker if your physician has prescribed it for you.  If you do not have this equipment, it will be provided for you after surgery. If you have not been contracted by the surgery center/hospital by the day before your surgery, call to confirm the date and time of your surgery. Leave-time from work may vary depending on the type of surgery you have.  Appropriate arrangements should be made prior to surgery with your employer. Prescriptions will be provided immediately following surgery by your doctor.  Have these filled as soon as possible after surgery and take the medication as directed. Remove nail polish on the operative foot. Wash the night before surgery.  The night before surgery wash the foot and leg well with the antibacterial soap provided and water paying special attention to beneath the toenails and in between the toes.  Rinse thoroughly with water and dry well with a towel.  Perform this wash unless told not to do so by your physician.  Enclosed: 1 Ice pack (please put in freezer the night before surgery)   1 Hibiclens skin cleaner   Pre-op Instructions  If you have any questions regarding the instructions, do not hesitate to call our office at any point during this process.   [place]: [address] , [place] [address] [phone]  [place]: [address] [place], [place] [address] [phone]  Dr. Dev Dhondt, DPM  

## 2022-11-05 ENCOUNTER — Telehealth: Payer: Self-pay | Admitting: Podiatry

## 2022-11-05 NOTE — Telephone Encounter (Signed)
DOS: 11/17/2022  Dr. Loel Lofty Assist  [facility] [employer] Effective 11/01/2022  Repair of Ankle Ligament (ATFL) Rt (57017) Ankle Arthroscopy w/ Microfracture Rt (79390)  Deductible: $200 with $0 met Out-of-Pocket: $2,500 with $0 met CoInsurance: 20%  Prior authorization is not required per Runner, broadcasting/film/video.  Call Reference #: [id number]

## 2022-11-07 NOTE — Progress Notes (Signed)
Subjective:   Patient ID: [name], female   DOB: 22 y.o.   MRN: [id number]   HPI Chief Complaint  Patient presents with   Foot Pain    Right ankle pain, pain on the lateral side of the ankle, rate of pain 6 out of 10, MRI results     22 year old female presents with her mom for the above concerns.  She presents today to discuss the MRI.  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  She is attempted after the injuries of sprained her ankle immobilization, physical therapy without significant improvement.  Given ongoing instability and pain she needs to proceed with surgical intervention if possible.   Review of Systems  All other systems reviewed and are negative.       Objective:  Physical Exam  General: AAO x3, NAD  Dermatological: Skin is warm, dry and supple bilateral.  There are no open sores, no preulcerative lesions, no rash or signs of infection present.  Vascular: Dorsalis Pedis artery and Posterior Tibial artery pedal pulses are 2/4 bilateral with immedate capillary fill time. There is no pain with calf compression, swelling, warmth, erythema.   Neruologic: Grossly intact via light touch bilateral.   Musculoskeletal: Swelling noted along the anterior lateral aspect the ankle.  There is increased anterior drawer as well as talar tilt on the right side compared to the contralateral extremity.  Tender to palpation lateral ankle complex.  No area pinpoint tenderness.  MMT 5/5.        Assessment:   22 year old female with right lateral ankle instability, soft tissue mass     Plan:  -Treatment options discussed including all alternatives, risks, and complications -Etiology of symptoms were discussed -I reviewed the MRI with her.  We have discussed both conservative as well as surgical treatment options.  She is attempted conservative care without significant improvement and this time she wants  to proceed with surgical intervention.  I discussed with her ATFL repair, ankle arthroscopy, debridement of the ankle joint and possible microfracture.  She wishes to proceed with this. -The incision placement as well as the postoperative course was discussed with the patient. I discussed risks of the surgery which include, but not limited to, infection, bleeding, pain, swelling, need for further surgery, delayed or nonhealing, painful or ugly scar, numbness or sensation changes, over/under correction, recurrence, transfer lesions, further deformity, hardware failure, DVT/PE, loss of toe/foot. Patient understands these risks and wishes to proceed with surgery. The surgical consent was reviewed with the patient all 3 pages were signed. No promises or guarantees were given to the outcome of the procedure. All questions were answered to the best of my ability. Before the surgery the patient was encouraged to call the office if there is any further questions. The surgery will be performed at the [facility] on an outpatient basis.  Trula Slade DPM

## 2022-11-17 ENCOUNTER — Other Ambulatory Visit: Payer: Self-pay | Admitting: Podiatry

## 2022-11-17 DIAGNOSIS — S93409A Sprain of unspecified ligament of unspecified ankle, initial encounter: Secondary | ICD-10-CM | POA: Diagnosis not present

## 2022-11-17 DIAGNOSIS — M24071 Loose body in right ankle: Secondary | ICD-10-CM | POA: Diagnosis not present

## 2022-11-17 DIAGNOSIS — S93491A Sprain of other ligament of right ankle, initial encounter: Secondary | ICD-10-CM | POA: Diagnosis not present

## 2022-11-17 DIAGNOSIS — M948X1 Other specified disorders of cartilage, shoulder: Secondary | ICD-10-CM | POA: Diagnosis not present

## 2022-11-17 DIAGNOSIS — M659 Synovitis and tenosynovitis, unspecified: Secondary | ICD-10-CM | POA: Diagnosis not present

## 2022-11-17 DIAGNOSIS — M25371 Other instability, right ankle: Secondary | ICD-10-CM | POA: Diagnosis not present

## 2022-11-17 DIAGNOSIS — G8918 Other acute postprocedural pain: Secondary | ICD-10-CM | POA: Diagnosis not present

## 2022-11-17 MED ORDER — OXYCODONE-ACETAMINOPHEN 5-325 MG PO TABS: 1.0000 | ORAL_TABLET | Freq: Four times a day (QID) | ORAL | 0 refills | Status: DC | PRN

## 2022-11-17 MED ORDER — CEPHALEXIN 500 MG PO CAPS: 500.0000 mg | ORAL_CAPSULE | Freq: Three times a day (TID) | ORAL | 0 refills | Status: DC

## 2022-11-17 MED ORDER — PROMETHAZINE HCL 25 MG PO TABS: 25.0000 mg | ORAL_TABLET | Freq: Three times a day (TID) | ORAL | 0 refills | Status: DC | PRN

## 2022-11-17 NOTE — Progress Notes (Signed)
Post-op medication sent

## 2022-11-22 ENCOUNTER — Ambulatory Visit (INDEPENDENT_AMBULATORY_CARE_PROVIDER_SITE_OTHER): Payer: 59

## 2022-11-22 ENCOUNTER — Ambulatory Visit (INDEPENDENT_AMBULATORY_CARE_PROVIDER_SITE_OTHER): Payer: 59 | Admitting: *Deleted

## 2022-11-22 VITALS — BP 143/77 | HR 86

## 2022-11-22 DIAGNOSIS — M25372 Other instability, left ankle: Secondary | ICD-10-CM | POA: Diagnosis not present

## 2022-11-22 DIAGNOSIS — S93409A Sprain of unspecified ligament of unspecified ankle, initial encounter: Secondary | ICD-10-CM

## 2022-11-22 DIAGNOSIS — Z9889 Other specified postprocedural states: Secondary | ICD-10-CM | POA: Diagnosis not present

## 2022-11-22 DIAGNOSIS — M25371 Other instability, right ankle: Secondary | ICD-10-CM

## 2022-11-22 MED ORDER — IBUPROFEN 800 MG PO TABS: 800.0000 mg | ORAL_TABLET | Freq: Three times a day (TID) | ORAL | 5 refills | Status: AC

## 2022-11-22 NOTE — Progress Notes (Signed)
Patient presents today for post op visit # 1, patient of [facility].   POV #1 DOS 11/17/2022 RT ANKLE REPAIR OF ANKLE LIGAMNET (ATFL), ANKLE ARTHESCOPY W/MICROFRACTURE OF ANKLE JOINT   Patient presents in her walking boot non-weight bearing on her knee scooter. Denies any falls or injury to the foot. Foot is swollen. No signs of infection. No calf pain or shortness of breath. Bandages dry and intact. Incision is intact.  Taking pain medication and antibiotic as instructed.   BP: 143/77 P: 86   Xrays taken today and reviewed by Dr. Jacqualyn Posey. He did take a look at her foot today as well.  Sent Ibuprofen '800mg'$  TID into her pharmacy per Dr. Jacqualyn Posey.   Foot redressed today and placed back in the boot. Reviewed icing and elevation. Patient will follow up with Dr. Jacqualyn Posey next week for POV# 2.

## 2022-11-30 ENCOUNTER — Ambulatory Visit (INDEPENDENT_AMBULATORY_CARE_PROVIDER_SITE_OTHER): Payer: 59

## 2022-11-30 DIAGNOSIS — S93409A Sprain of unspecified ligament of unspecified ankle, initial encounter: Secondary | ICD-10-CM

## 2022-11-30 NOTE — Progress Notes (Signed)
Patient presents today for post op visit # 2, patient of Dr. Jacqualyn Posey.   POV # 2 DOS 11/17/22     Half of patient's sutures removed today without complication. Patient tolerated suture removal well. Patient advised to remain non-weight bearing.    Reviewed icing and elevation. Patient will follow up with Dr. Jacqualyn Posey  for POV# 3 x-rays.

## 2022-12-14 ENCOUNTER — Ambulatory Visit (INDEPENDENT_AMBULATORY_CARE_PROVIDER_SITE_OTHER): Payer: 59 | Admitting: Podiatry

## 2022-12-14 ENCOUNTER — Encounter: Payer: Self-pay | Admitting: Podiatry

## 2022-12-14 VITALS — BP 128/80 | HR 86

## 2022-12-14 DIAGNOSIS — S93409A Sprain of unspecified ligament of unspecified ankle, initial encounter: Secondary | ICD-10-CM

## 2022-12-14 DIAGNOSIS — M899 Disorder of bone, unspecified: Secondary | ICD-10-CM

## 2022-12-14 DIAGNOSIS — M949 Disorder of cartilage, unspecified: Secondary | ICD-10-CM

## 2022-12-14 DIAGNOSIS — M25371 Other instability, right ankle: Secondary | ICD-10-CM

## 2022-12-14 NOTE — Progress Notes (Signed)
Subjective:   Patient ID: Robin Harris, female   DOB: 22 y.o.   MRN: [id number]   HPI Chief Complaint  Patient presents with   Routine Post Op    "It's doing good."   DOS: 11/17/2022 Procedure: Left ankle arthroscopy with microfracture, removal of loose body; ATFL repair   22 year old female presents with her mom for the above concerns for POV #3 s/p the above procedures.  She saw some sutures to be doing removed.  Otherwise she has been doing well.  Minimal discomfort.  No fevers or chills.  No other concerns.       Objective:  Physical Exam  General: AAO x3, NAD  Dermatological: Incision well coapted with Sutures still intact.  No erythema warmth or any signs of infection.  There is no open lesions. Vascular: Dorsalis Pedis artery and Posterior Tibial artery pedal pulses are 2/4 bilateral with immedate capillary fill time. There is no pain with calf compression, swelling, warmth, erythema.   Neruologic: Grossly intact via light touch bilateral.   Musculoskeletal: Mild discomfort along the ATFL site.  No other areas of discomfort.  There is decreased edema compared to what it was preoperatively.       Assessment:   22 year old female with right lateral ankle instability     Plan:  -Treatment options discussed including all alternatives, risks, and complications -Etiology of symptoms were discussed -Removed remainder of sutures today.  Small mount of antibiotic ointment was applied followed by dressing.  She can continue with home dressing changes and wash with soap and water. -New cam boot, nonweightbearing for now -Ice, elevation as well as compression help with any residual edema. -Start PT and transition to [facility] in boot next appt  Trula Slade DPM

## 2022-12-28 ENCOUNTER — Ambulatory Visit (INDEPENDENT_AMBULATORY_CARE_PROVIDER_SITE_OTHER): Payer: 59 | Admitting: Podiatry

## 2022-12-28 ENCOUNTER — Ambulatory Visit (INDEPENDENT_AMBULATORY_CARE_PROVIDER_SITE_OTHER): Payer: 59

## 2022-12-28 DIAGNOSIS — M25371 Other instability, right ankle: Secondary | ICD-10-CM | POA: Diagnosis not present

## 2022-12-29 ENCOUNTER — Ambulatory Visit: Payer: 59 | Attending: Internal Medicine

## 2022-12-29 DIAGNOSIS — M25671 Stiffness of right ankle, not elsewhere classified: Secondary | ICD-10-CM | POA: Insufficient documentation

## 2022-12-29 DIAGNOSIS — M25371 Other instability, right ankle: Secondary | ICD-10-CM | POA: Insufficient documentation

## 2022-12-29 DIAGNOSIS — R29898 Other symptoms and signs involving the musculoskeletal system: Secondary | ICD-10-CM | POA: Diagnosis not present

## 2022-12-29 NOTE — Addendum Note (Signed)
Addended by: Pincus Badder on: 12/29/2022 06:31 PM   Modules accepted: Orders

## 2022-12-29 NOTE — Therapy (Signed)
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 Girtha Rm [facility]), in remission ==> rec yearly CBC, CMP, CPE, and echo Q47yr - [facility]   Arthritis    pt states she does not have arthritis   Past Surgical History:  Procedure Laterality Date   MYRINGOTOMY  2005   Bilateral   PORT-A-CATH REMOVAL  2010   PORTACATH PLACEMENT  2007   for chemo   TUMOR REMOVAL  2008   benign; behind left ear   Patient Active Problem List   Diagnosis Date Noted   Overweight with body mass index (BMI) of 29 to 29.9 in adult 09/16/2022   History of acute lymphoblastic leukemia (ALL) 10/27/2021    PCP: RWebb Silversmith NP  REFERRING PROVIDER: MCelesta Gentile DPM  REFERRING DIAG: [phone] THERAPY DIAG:  Right ankle instability  Ankle weakness  Stiffness of right ankle, not elsewhere classified  Rationale for Evaluation and Treatment: Rehabilitation  ONSET DATE: chronic, surgery was 11/17/22  SUBJECTIVE:   SUBJECTIVE STATEMENT: 11/17/22 pt had surgery on R ankle for ATFL repair, microfx, removal of loose body via arthroscopic surgery.   H/o multiple sprains leading to chronic swelling and pain.  She is currently PWB in a CAM walking boot.  F/u January 18, 2023.  Currently she reports her anlke is feeling better.  She is noticing "tightness" in R lateral ankle.  She wears the  boot and uses b/l crutches when walking PWB.  Not wearing boot with sleeping or driving.    PERTINENT HISTORY: Pt played basketball in high school.   PAIN:  Are you having pain? No  PRECAUTIONS: Other: PWB in CAM walking boot, can progress to WBAT in boot  WEIGHT BEARING RESTRICTIONS: Yes PWB  FALLS:  Has patient fallen in last 6 months? 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    NEXT MD VISIT: 01/18/23  OBJECTIVE:   DIAGNOSTIC FINDINGS: MRI before surgery (see chart review)  PATIENT SURVEYS:  FOTO 37/67  COGNITION: Overall cognitive status: Within functional limits for tasks assessed     SENSATION: WFL  EDEMA:  Figure 8 R and L 49.5  OBSERVATION: pt's lateral incision site is still healing at distal end  POSTURE: pt arrived wearing CAM walking boot and using b/l axillary crutches  PALPATION: Mild typical swelling noted along R lateral ankle/malleolar region  LOWER EXTREMITY  ROM:  Active ROM Right eval Left eval  Ankle dorsiflexion 5 12  Ankle plantarflexion 40 55  Ankle inversion    Ankle eversion     (Blank rows = not tested)  LOWER EXTREMITY MMT:  MMT Right eval Left eval  Ankle dorsiflexion 5 3+  Ankle plantarflexion 5 2   Ankle inversion 5 3+  Ankle eversion 5 3+   (Blank rows = not tested) tested as a "isometric make test" in neutral position of ankle    TODAY'S TREATMENT:                                                                                                                              DATE: 12/29/22  Initiated HEP today:  S/l hip abd: 2x  to fatigue Prone hip extension: 2x to fatigue Long sitting NWB DF/gastroc stretch: with towel, 3x 20 sec  PATIENT EDUCATION:  Education details: HEP, PT POC/goals, reviewed WB precautions Person educated: Patient Education method: Explanation, Demonstration, and Handouts Education comprehension: verbalized understanding and needs further education  HOME EXERCISE PROGRAM: Access Code: [email or web address] URL: [email or web address] Date: 12/29/2022 Prepared by: Merdis Delay  Exercises - Sidelying Hip Abduction  - 1 x daily - 7 x weekly - 2 sets - 20 reps - Prone Hip Extension  - 1 x daily - 7 x weekly - 2 sets - 20 reps - Long Sitting Calf Stretch with Strap  - 2 x daily - 7 x weekly - 5 reps - 20 hold  ASSESSMENT:  CLINICAL IMPRESSION: Patient is a pleasant 22 y.o. F who was seen today for physical therapy evaluation and treatment for a course of post-op physical therapy after undergoing R ankle ATFL repair, microfracture, and removal of loose body.  She is an appropriate candidate for skilled PT to address the impairments listed below  OBJECTIVE IMPAIRMENTS: decreased activity tolerance, decreased balance, decreased endurance, decreased mobility, difficulty walking, decreased ROM, decreased strength, and pain.   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   REHAB POTENTIAL: Excellent  CLINICAL DECISION MAKING: Stable/uncomplicated  EVALUATION COMPLEXITY: Low   GOALS: Goals reviewed with patient? Yes  SHORT TERM GOALS: Target date: 01/20/23 (by her follow up with physician) Pt will be able to amb with normal R heel  strike/toe off in sneaker WBAT Baseline: initial eval PWB in CAM boot  Goal status: INITIAL  2.  Improve ankle DF R to >10 deg to facilitate normal gait pattern on flat surfaces  Baseline: initial eval 5 degrees Goal status: INITIAL    LONG TERM GOALS: Target date: 02/23/23  Improve R ankle strength to 4/5 to allow for CKC single leg stance dynamic strengthening in preparation for return to PLOF Baseline: currently PWB at initial eval  Goal status: INITIAL  2.  Improve FOTO to >50 indicating improved ability to return to PLOF Baseline: 37 (target expected 67) Goal status: INITIAL  3.  Pt will be able to amb on flat/uneven surfaces without any AD x 20 min without any increase in swelling in preparation for higher level sports specific rehab Baseline: currently PWB Goal status: INITIAL    PLAN:  PT FREQUENCY: 2x/week  PT DURATION: 8 weeks  PLANNED INTERVENTIONS: Therapeutic exercises, Therapeutic activity, Neuromuscular re-education, Balance training, Gait training, Patient/Family education, Self Care, and Joint mobilization  PLAN FOR NEXT SESSION: begin ankle isometrics/ROM/strengthening; pt to bring her matching sneaker for PT; PWB precautions gradually progressing to [facility], PT, DPT, OCS  [id number]  Pincus Badder, PT 12/29/2022, 6:28 PM

## 2022-12-30 ENCOUNTER — Other Ambulatory Visit: Payer: Self-pay | Admitting: Podiatry

## 2022-12-30 DIAGNOSIS — M25371 Other instability, right ankle: Secondary | ICD-10-CM

## 2023-01-03 ENCOUNTER — Encounter: Payer: Self-pay | Admitting: Physical Therapy

## 2023-01-03 ENCOUNTER — Ambulatory Visit: Payer: 59 | Attending: Internal Medicine | Admitting: Physical Therapy

## 2023-01-03 DIAGNOSIS — R29898 Other symptoms and signs involving the musculoskeletal system: Secondary | ICD-10-CM | POA: Diagnosis not present

## 2023-01-03 DIAGNOSIS — M25671 Stiffness of right ankle, not elsewhere classified: Secondary | ICD-10-CM | POA: Diagnosis not present

## 2023-01-03 DIAGNOSIS — M25371 Other instability, right ankle: Secondary | ICD-10-CM | POA: Diagnosis not present

## 2023-01-03 NOTE — Therapy (Signed)
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 Girtha [facility]m [facility]), in remission ==> rec yearly CBC, CMP, CPE, and echo Q49yr - [facility]see [facility] dated [facility]   Arthritis    pt states she does not have arthritis   Past Surgical History:  Procedure Laterality Date   MYRINGOTOMY  2005   Bilateral   PORT-A-CATH REMOVAL  2010   PORTACATH PLACEMENT  2007   for chemo   TUMOR REMOVAL  2008   benign; behind left ear   Patient Active Problem List   Diagnosis Date Noted   Overweight with body mass index (BMI) of 29 to 29.9 in adult 09/16/2022   History of acute lymphoblastic leukemia (ALL) 10/27/2021    PCP: RWebb Silversmith NP  REFERRING PROVIDER: MCelesta Gentile DPM  REFERRING DIAG: [phone] THERAPY DIAG:  Right ankle instability  Ankle weakness  Stiffness of right ankle, not elsewhere classified  Rationale for Evaluation and Treatment: Rehabilitation  ONSET DATE: chronic, surgery was 11/17/22  SUBJECTIVE:   SUBJECTIVE STATEMENT: 11/17/22 pt had surgery on R ankle for ATFL repair, microfx, removal of loose body via arthroscopic surgery.   H/o multiple sprains leading to chronic swelling and pain.  She is currently PWB in a CAM walking boot.  F/u January 18, 2023.  Currently she reports her anlke is feeling better.  She is noticing "tightness" in R lateral  ankle.  She wears the boot and uses b/l crutches when walking PWB.  Not wearing boot with sleeping or driving.    PERTINENT HISTORY: Pt played basketball in high school.   PAIN:  Are you having pain? No  PRECAUTIONS: Other: PWB in CAM walking boot, can progress to WBAT in boot  WEIGHT BEARING RESTRICTIONS: Yes PWB  FALLS:  Has patient fallen in last 6 months? 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    NEXT MD VISIT: 01/18/23  OBJECTIVE:   DIAGNOSTIC FINDINGS: MRI before surgery (see chart review)  PATIENT SURVEYS:  FOTO 37/67  COGNITION: Overall cognitive status: Within functional limits for tasks assessed     SENSATION: WFL  EDEMA:  Figure 8 R and L 49.5  OBSERVATION: pt's lateral incision site is still healing at distal end  POSTURE: pt arrived wearing CAM walking boot and using b/l axillary crutches  PALPATION: Mild typical swelling noted along  R lateral ankle/malleolar region  LOWER EXTREMITY ROM:  Active ROM Right eval Left eval  Ankle dorsiflexion 5 12  Ankle plantarflexion 40 55  Ankle inversion    Ankle eversion     (Blank rows = not tested)  LOWER EXTREMITY MMT:  MMT Right eval Left eval  Ankle dorsiflexion 5 3+  Ankle plantarflexion 5 2   Ankle inversion 5 3+  Ankle eversion 5 3+   (Blank rows = not tested) tested as a "isometric make test" in neutral position of ankle    TODAY'S TREATMENT:                                                                                                                              DATE: 01/03/23   Subjective: No  c/o R ankle/foot pain prior to tx.  Session.  Pt. Entered PT use of walking boot/ axillary crutches.   Pt. Returns to MD on 3/19 and is suppose to be of crutches/ boot.    There.ex.:  Walking in //-bars (forward/ backwards/ lateral) with use of sneaker on R foot working on consistent heel strike/ toe off/ wt. Bearing with B UE as needed.  Mirror feedback for posture.     Standing marching/ heel raises/ toe raises/ hamstring curls 20x each with //-bar/ mirror assist.    Standing partial lunges (L/R) in //-bars with UE assist for control 10x2.    Nustep L4 10 min. B UE/LE (good R ankle control/ position).    Supine R ankle stretches: DF/PF/IV/EV 3x each with static holds.    Supine R ankle manual isometrics (minimal resistance)- 5x 5 sec. Holds (no pain).  Limited R ankle EV.  Supine ankle alphabet with LE elevated.  Ice to R ankle after tx.    Amb. From PT gym to car with use of single crutch on L and 2-point gait pattern while wearing shoe.  No increase pain.  Pt. Instructed to use boot with prolonged activity outside and wean to sneaker/ single crutch to prevent antalgic gait.     PATIENT EDUCATION:  Education details: HEP, PT POC/goals, reviewed WB precautions Person educated: Patient Education method: Explanation, Demonstration, and Handouts Education comprehension: verbalized understanding and needs further education  HOME EXERCISE PROGRAM: Access Code: [email or web address] URL: [email or web address] Date: 12/29/2022 Prepared by: Merdis Delay  Exercises - Sidelying Hip Abduction  - 1 x daily - 7 x weekly - 2 sets - 20 reps - Prone Hip Extension  - 1 x daily - 7 x weekly - 2 sets - 20 reps - Long Sitting Calf Stretch with Strap  - 2 x daily - 7 x weekly - 5 reps - 20 hold  ASSESSMENT:  CLINICAL IMPRESSION: Pt. Presents to PT in walking boot and able to don sneaker and progress gait in //-bars with cuing for correct heel strike/ toe off.  Limited R ankle IV/EV ROM and  strength noted during manual stretches.  Pt. Has noted R gastroc  atrophy as compared to L in standing posture.  Good technique with standing ex. And able to progress to single crutch.  Pt. Instructed to use crutch on L side with 2-point gait secondary to marked R antalgic gait with no assistive device.   She is an appropriate candidate for skilled PT to address the impairments listed below  OBJECTIVE IMPAIRMENTS: decreased activity tolerance, decreased balance, decreased endurance, decreased mobility, difficulty walking, decreased ROM, decreased strength, and pain.   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   REHAB POTENTIAL: Excellent  CLINICAL DECISION MAKING: Stable/uncomplicated  EVALUATION COMPLEXITY: Low   GOALS: Goals reviewed with patient? Yes  SHORT TERM GOALS: Target date: 01/20/23 (by her follow up with physician) Pt will be able to amb with normal R heel strike/toe off in sneaker WBAT Baseline: initial eval PWB in CAM boot  Goal status: INITIAL  2.  Improve ankle DF R to >10 deg to facilitate normal gait pattern on flat surfaces  Baseline: initial eval 5 degrees Goal status: INITIAL    LONG TERM GOALS: Target date: 02/23/23  Improve R ankle strength to 4/5 to allow for CKC single leg stance dynamic strengthening in preparation for return to PLOF Baseline: currently PWB at initial eval Goal status: INITIAL  2.  Improve FOTO to >50 indicating improved ability to return to PLOF Baseline: 37 (target expected 67) Goal status: INITIAL  3.  Pt will be able to amb on flat/uneven surfaces without any AD x 20 min without any  increase in swelling in preparation for higher level sports specific rehab Baseline: currently PWB Goal status: INITIAL    PLAN:  PT FREQUENCY: 2x/week  PT DURATION: 8 weeks  PLANNED INTERVENTIONS: Therapeutic exercises, Therapeutic activity, Neuromuscular re-education, Balance training, Gait training, Patient/Family education, Self Care, and Joint mobilization  PLAN FOR NEXT SESSION: PWB precautions gradually progressing to WBAT.  ISSUE standing LE HEP  Pura Spice, PT, DPT # [phone] 01/03/2023, 4:30 PM

## 2023-01-04 NOTE — Progress Notes (Signed)
Subjective:   Patient ID: Robin Harris, female   DOB: 22 y.o.   MRN: [id number]   HPI Chief Complaint  Patient presents with   Routine Post Op    POV #2 DOS 11/17/2022 RT ANKLE REPAIR OF ANKLE LIGAMNET (ATFL), ANKLE ARTHESCOPY W/MICROFRACTURE OF ANKLE JOINT, patient denies any pain, NO N/V/F/C/SOB, X-rays taken today     DOS: 11/17/2022 Procedure: Left ankle arthroscopy with microfracture, removal of loose body; ATFL repair   22 year old female presents with her mom for the above concerns.  She states that she is feeling well and she is ready to start moving more.  No recent injuries or changes.       Objective:  Physical Exam  General: AAO x3, NAD  Dermatological: Incision well coapted with Sutures still intact.  No erythema warmth or any signs of infection.  There is no open lesions.  Vascular: Dorsalis Pedis artery and Posterior Tibial artery pedal pulses are 2/4 bilateral with immedate capillary fill time. There is no pain with calf compression, swelling, warmth, erythema.   Neruologic: Grossly intact via light touch bilateral.   Musculoskeletal: There is still some mild discomfort along the ATFL site, however improving.  No other areas of discomfort.  There is decreased edema compared to what it was preoperatively.       Assessment:   22 year old female with right lateral ankle instability     Plan:  -Treatment options discussed including all alternatives, risks, and complications -Etiology of symptoms were discussed -X-rays were obtained reviewed of the ankle.  3 views were obtained.  No evidence of acute fracture.  Some small calcifications noted along the lateral ankle gutter. -Presently discussed with transition to partial weight-bear as tolerated in the cam boot.  Will start physical therapy as well and now she improves with this we can start to weight-bear as tolerated in the cam boot. -Ice, elevation as well as compression help with any residual  edema.  Trula Slade DPM

## 2023-01-05 ENCOUNTER — Ambulatory Visit: Payer: 59

## 2023-01-05 DIAGNOSIS — R29898 Other symptoms and signs involving the musculoskeletal system: Secondary | ICD-10-CM | POA: Diagnosis not present

## 2023-01-05 DIAGNOSIS — M25671 Stiffness of right ankle, not elsewhere classified: Secondary | ICD-10-CM

## 2023-01-05 DIAGNOSIS — M25371 Other instability, right ankle: Secondary | ICD-10-CM

## 2023-01-05 NOTE — Therapy (Addendum)
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 Girtha [facility]m [facility]), in remission ==> rec yearly CBC, CMP, CPE, and echo Q4yr - [facility]see [facility]   Arthritis    pt states she does not have arthritis   Past Surgical History:  Procedure Laterality Date   MYRINGOTOMY  2005   Bilateral   PORT-A-CATH REMOVAL  2010   PORTACATH PLACEMENT  2007   for chemo   TUMOR REMOVAL  2008   benign; behind left ear   Patient Active Problem List   Diagnosis Date Noted   Overweight with body mass index (BMI) of 29 to 29.9 in adult 09/16/2022   History of acute lymphoblastic leukemia (ALL) 10/27/2021    PCP: RWebb Silversmith NP  REFERRING PROVIDER: MCelesta Gentile DPM  REFERRING DIAG: [phone] THERAPY DIAG:  Right ankle instability  Ankle weakness  Stiffness of right ankle, not elsewhere classified  Rationale for Evaluation and Treatment: Rehabilitation  ONSET DATE: chronic, surgery was 11/17/22  SUBJECTIVE:   SUBJECTIVE STATEMENT: 11/17/22 pt had surgery on R ankle for ATFL repair, microfx, removal of loose body via arthroscopic surgery.   H/o multiple sprains leading to chronic swelling and pain.  She is currently PWB in a CAM walking boot.  F/u January 18, 2023.  Currently she reports her anlke is feeling better.  She is noticing "tightness" in R lateral  ankle.  She wears the boot and uses b/l crutches when walking PWB.  Not wearing boot with sleeping or driving.    PERTINENT HISTORY: Pt played basketball in high school.   PAIN:  Are you having pain? No  PRECAUTIONS: Other: PWB in CAM walking boot, can progress to WBAT in boot  WEIGHT BEARING RESTRICTIONS: Yes PWB  FALLS:  Has patient fallen in last 6 months? 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    NEXT MD VISIT: 01/18/23  OBJECTIVE:   DIAGNOSTIC FINDINGS: MRI before surgery (see chart review)  PATIENT SURVEYS:  FOTO 37/67  COGNITION: Overall cognitive status: Within functional limits for tasks assessed     SENSATION: WFL  EDEMA:  Figure 8 R and L 49.5  OBSERVATION: pt's lateral incision site is still healing at distal end  POSTURE: pt arrived wearing CAM walking boot and using b/l axillary crutches  PALPATION: Mild typical swelling noted along  R lateral ankle/malleolar region  LOWER EXTREMITY ROM:  Active ROM Right eval Left eval  Ankle dorsiflexion 5 12  Ankle plantarflexion 40 55  Ankle inversion    Ankle eversion     (Blank rows = not tested)  LOWER EXTREMITY MMT:  MMT Right eval Left eval  Ankle dorsiflexion 5 3+  Ankle plantarflexion 5 2   Ankle inversion 5 3+  Ankle eversion 5 3+   (Blank rows = not tested) tested as a "isometric make test" in neutral position of ankle    TODAY'S TREATMENT:                                                                                                                              DATE: 01/05/23   Subjective: Pt  arrived wearing sneakers and using 1 crutch.  She is sore upon arrival- notes her calf muscle is most sore today.  Also having some mild swelling along lateral ankle.  Pain level 4/10   There.ex.:  Walking in //-bars (forward/ backwards/ lateral) with use of sneaker on R foot working on consistent heel strike/ toe off/ wt. Bearing with B UE as needed.  Mirror feedback for posture.     Standing marching/ heel raises/ toe raises/ hamstring curls 20x each with //-bar/ mirror assist.    Standing partial lunges (L/R) in //-bars with UE assist for control 10x2.    Nustep L4 10 min. B UE/LE (good R ankle control/ position).    Supine R ankle stretches: DF/PF/IV/EV 3x each with static holds.    Supine R ankle manual isometrics (minimal resistance)- 5x 5 sec. Holds (no pain).  Limited R ankle EV.  Supine ankle alphabet with LE elevated- not today  Gluteal mm retraining: Bridge with hip abd band: 5 sec holds x20 S/L hip abd 2x12 Standing hip extension leaning over high table: 2x12 (L LE)  Ice to R ankle after tx.    Pt instructed on adjusting to use b/l crutches if pain level >4/10 on NPRS this weekend; otherwise continue with single crutch for short distances in home.    PATIENT EDUCATION:  Education details: HEP, PT POC/goals, reviewed WB precautions Person educated: Patient Education method: Explanation, Demonstration, and Handouts Education comprehension: verbalized understanding and needs further education  HOME EXERCISE PROGRAM: Access Code: [email or web address] URL: [email or web address] Date: 01/05/2023 Prepared by: Merdis Delay  Exercises - Sidelying Hip Abduction  - 1 x daily - 7 x weekly - 2 sets - 20 reps - Prone Hip Extension  - 1 x daily - 7 x weekly - 2 sets - 20 reps - Bridge with Hip Abduction and Resistance - Ground Touches  - 1 x daily - 7 x weekly - 2 sets - 10 reps - Long Sitting Calf Stretch with Strap  - 2 x daily - 7 x weekly - 5 reps - 20 hold -  Standing Heel Raise with Support  - 1 x daily - 7 x weekly -  2 sets - 10 reps  ASSESSMENT:  CLINICAL IMPRESSION: Pt arrived with 4/10 pain reported in gastroc mm and lateral ankle.  Keep weight bearing intensity similar, did not progress from last session due to pain level.  Focused additionally on hip mm retraining today to promote optimal LE kinetic chain mechanics and strength for weight bearing/functional progression throughout rehab process.  Overall, she notes no increase in pain at end of session.  Ended with cold pack for pain control.  She is an appropriate candidate for skilled PT to address the impairments listed below  OBJECTIVE IMPAIRMENTS: decreased activity tolerance, decreased balance, decreased endurance, decreased mobility, difficulty walking, decreased ROM, decreased strength, and pain.   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   REHAB POTENTIAL: Excellent  CLINICAL DECISION MAKING: Stable/uncomplicated  EVALUATION COMPLEXITY: Low   GOALS: Goals reviewed with patient? Yes  SHORT TERM GOALS: Target date: 01/20/23 (by her follow up with physician) Pt will be able to amb with normal R heel strike/toe off in sneaker WBAT Baseline: initial eval PWB in CAM boot  Goal status: INITIAL  2.  Improve ankle DF R to >10 deg to facilitate normal gait pattern on flat surfaces  Baseline: initial eval 5 degrees Goal status: INITIAL    LONG TERM GOALS: Target date: 02/23/23  Improve R ankle strength to 4/5 to allow for CKC single leg stance dynamic strengthening in preparation for return to PLOF Baseline: currently PWB at  initial eval Goal status: INITIAL  2.  Improve FOTO to >50 indicating improved ability to return to PLOF Baseline: 37 (target expected 67) Goal status: INITIAL  3.  Pt will be able to amb on flat/uneven surfaces without any AD x 20 min without any increase in swelling in preparation for higher level sports specific rehab Baseline: currently PWB Goal status: INITIAL    PLAN:  PT FREQUENCY: 2x/week  PT DURATION: 8 weeks  PLANNED INTERVENTIONS: Therapeutic exercises, Therapeutic activity, Neuromuscular re-education, Balance training, Gait training, Patient/Family education, Self Care, and Joint mobilization  PLAN FOR NEXT SESSION: PWB precautions gradually progressing to WBAT.  ISSUE standing LE HEP  Merdis Delay, PT, DPT, [place]  #[id number]  01/05/2023, 4:51 PM

## 2023-01-10 ENCOUNTER — Ambulatory Visit: Payer: No Typology Code available for payment source | Admitting: Dermatology

## 2023-01-10 ENCOUNTER — Ambulatory Visit: Payer: 59 | Admitting: Physical Therapy

## 2023-01-10 DIAGNOSIS — R29898 Other symptoms and signs involving the musculoskeletal system: Secondary | ICD-10-CM

## 2023-01-10 DIAGNOSIS — M25371 Other instability, right ankle: Secondary | ICD-10-CM | POA: Diagnosis not present

## 2023-01-10 DIAGNOSIS — M25671 Stiffness of right ankle, not elsewhere classified: Secondary | ICD-10-CM | POA: Diagnosis not present

## 2023-01-10 NOTE — Therapy (Signed)
OUTPATIENT PHYSICAL THERAPY LOWER EXTREMITY TREATMENT   Patient Name: Robin Harris MRN: [id number] DOB:11-08-2000, 22 y.o., female Today's Date: 01/10/2023  END OF SESSION:  PT End of Session - 01/10/23 1516     Visit Number 4    Number of Visits 16    Date for PT Re-Evaluation 02/23/23    Authorization Type 2x/week for 8 weeks    PT Start Time 1512    PT Stop Time 1603    PT Time Calculation (min) 51 min    Activity Tolerance Patient tolerated treatment well    Behavior During Therapy [facility] for tasks assessed/performed             Past Medical History:  Diagnosis Date   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 Girtha [facility]m [facility]), in remission ==> rec yearly CBC, CMP, CPE, and echo Q54yr - [facility]see note dated [facility]   Arthritis    pt states she does not have arthritis   Past Surgical History:  Procedure Laterality Date   MYRINGOTOMY  2005   Bilateral   PORT-A-CATH REMOVAL  2010   PORTACATH PLACEMENT  2007   for chemo   TUMOR REMOVAL  2008   benign; behind left ear   Patient Active Problem List   Diagnosis Date Noted   Overweight with body mass index (BMI) of 29 to 29.9 in adult 09/16/2022   History of acute lymphoblastic leukemia (ALL) 10/27/2021    PCP: RWebb Silversmith NP  REFERRING PROVIDER: MCelesta Gentile DPM  REFERRING DIAG: [phone] THERAPY DIAG:  Right ankle instability  Ankle weakness  Stiffness of right ankle, not elsewhere classified  Rationale for Evaluation and Treatment: Rehabilitation  ONSET DATE: chronic, surgery was 11/17/22  SUBJECTIVE:   SUBJECTIVE STATEMENT: 11/17/22 pt had surgery on R ankle for ATFL repair, microfx, removal of loose body via arthroscopic surgery.   H/o multiple sprains leading to chronic swelling and pain.  She is currently PWB in a CAM walking boot.  F/u January 18, 2023.  Currently she reports her anlke is feeling better.  She is noticing "tightness" in R lateral  ankle.  She wears the boot and uses b/l crutches when walking PWB.  Not wearing boot with sleeping or driving.    PERTINENT HISTORY: Pt played basketball in high school.   PAIN:  Are you having pain? No  PRECAUTIONS: Other: PWB in CAM walking boot, can progress to WBAT in boot  WEIGHT BEARING RESTRICTIONS: Yes PWB  FALLS:  Has patient fallen in last 6 months? 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    NEXT MD VISIT: 01/18/23  OBJECTIVE:   DIAGNOSTIC FINDINGS: MRI before surgery (see chart review)  PATIENT SURVEYS:  FOTO 37/67  COGNITION: Overall cognitive status: Within functional limits for tasks assessed     SENSATION: WFL  EDEMA:  Figure 8 R and L 49.5  OBSERVATION: pt's lateral incision site is still healing at distal end  POSTURE: pt arrived wearing CAM walking boot and using b/l axillary crutches  PALPATION: Mild typical swelling noted along  R lateral ankle/malleolar region  LOWER EXTREMITY ROM:  Active ROM Right eval Left eval  Ankle dorsiflexion 5 12  Ankle plantarflexion 40 55  Ankle inversion    Ankle eversion     (Blank rows = not tested)  LOWER EXTREMITY MMT:  MMT Right eval Left eval  Ankle dorsiflexion 5 3+  Ankle plantarflexion 5 2   Ankle inversion 5 3+  Ankle eversion 5 3+   (Blank rows = not tested) tested as a "isometric make test" in neutral position of ankle    TODAY'S TREATMENT:                                                                                                                              DATE: 01/10/23   Subjective: Pt  arrived wearing [employer] shoes and using 1 crutch.  Pt. States she has been using single crutch on a limited basis at home.  Pt. Has scar patch on R lateral ankle to assist with healing. No new complaints.    Pain level 0/10   There.ex.:  Nustep L4 10 min. B UE/LE (good R ankle control/ position).   TM walking with B UE assist at 1.8 to 2.0 mph.  Focus on consistent step pattern/ heel strike and toe off with no R antalgic gait.  Pt. Slowly weaning B UE to L UE and then no UE.  Slight R antalgic gait but able to correct pattern to a more normalized gait.  8 min. Total time.  Walking in //-bars (forward/ backwards/ lateral) working on consistent heel strike/ toe off/ wt. Bearing with minimal to no UE as needed.  Mirror feedback for posture.    Tandem stance/ gait.  SLS on L/R >30 sec.  Good R ankle control  Resisted gait lateral walking 5x on L/R.  No UE assist.   Supine R ankle stretches: DF/PF/IV/EV 3x each with static holds.    Supine R ankle manual isometrics (min to mod. resistance)- 5x 5 sec. Holds (no pain).  Limited R ankle EV.  Supine ankle alphabet with LE elevated- not today  Pt. Will ice at home.    Discussed HEP/ no changes to HEP   PATIENT EDUCATION:  Education details: HEP, PT POC/goals, reviewed WB precautions Person educated: Patient Education method: Explanation, Demonstration, and Handouts Education comprehension: verbalized understanding and needs further education  HOME EXERCISE PROGRAM: Access Code: [email or web address] URL: [email or web address] Date: 01/05/2023 Prepared by: Merdis Delay  Exercises - Sidelying Hip Abduction  - 1 x daily - 7 x weekly - 2 sets - 20 reps - Prone Hip Extension  - 1 x daily - 7 x weekly - 2 sets - 20 reps - Bridge with Hip Abduction and Resistance - Ground Touches  - 1 x daily - 7 x weekly - 2 sets - 10 reps - Long Sitting Calf Stretch with Strap  - 2 x daily - 7 x weekly - 5 reps - 20 hold -  Standing Heel Raise with  Support  - 1 x daily - 7 x weekly - 2 sets - 10 reps  ASSESSMENT:  CLINICAL IMPRESSION: Pt arrived with 0/10 R ankle pain prior to tx.  PT tx. Focused on R ankle stability and progressing gait pattern without single crutch.   Focused additionally on hip mm retraining today to promote optimal LE kinetic chain mechanics and strength for weight bearing/functional progression throughout rehab process.  Overall, she notes no increase in pain at end of session.  Pt. Will stop using single crutch with everyday walking and instructed to return to single crutch if R antalgic gait noted or pain.  She is an appropriate candidate for skilled PT to address the impairments listed below  OBJECTIVE IMPAIRMENTS: decreased activity tolerance, decreased balance, decreased endurance, decreased mobility, difficulty walking, decreased ROM, decreased strength, and pain.   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   REHAB POTENTIAL: Excellent  CLINICAL DECISION MAKING: Stable/uncomplicated  EVALUATION COMPLEXITY: Low   GOALS: Goals reviewed with patient? Yes  SHORT TERM GOALS: Target date: 01/20/23 (by her follow up with physician) Pt will be able to amb with normal R heel strike/toe off in sneaker WBAT Baseline: initial eval PWB in CAM boot  Goal status: INITIAL  2.  Improve ankle DF R to >10 deg to facilitate normal gait pattern on flat surfaces  Baseline: initial eval 5 degrees Goal status: INITIAL    LONG TERM GOALS: Target date: 02/23/23  Improve R ankle strength to 4/5 to allow for CKC single leg stance dynamic strengthening in preparation for return  to PLOF Baseline: currently PWB at initial eval Goal status: INITIAL  2.  Improve FOTO to >50 indicating improved ability to return to PLOF Baseline: 37 (target expected 67) Goal status: INITIAL  3.  Pt will be able to amb on flat/uneven surfaces without any AD x 20 min without any increase in swelling in preparation for higher level sports specific rehab Baseline: currently PWB Goal status: INITIAL    PLAN:  PT FREQUENCY: 2x/week  PT DURATION: 8 weeks  PLANNED INTERVENTIONS: Therapeutic exercises, Therapeutic activity, Neuromuscular re-education, Balance training, Gait training, Patient/Family education, Self Care, and Joint mobilization  PLAN FOR NEXT SESSION: Reassess gait pattern.  Pura Spice, PT, DPT # [phone] 01/10/2023, 6:58 PM

## 2023-01-12 ENCOUNTER — Ambulatory Visit: Payer: 59

## 2023-01-12 DIAGNOSIS — R29898 Other symptoms and signs involving the musculoskeletal system: Secondary | ICD-10-CM

## 2023-01-12 DIAGNOSIS — M25371 Other instability, right ankle: Secondary | ICD-10-CM | POA: Diagnosis not present

## 2023-01-12 DIAGNOSIS — M25671 Stiffness of right ankle, not elsewhere classified: Secondary | ICD-10-CM | POA: Diagnosis not present

## 2023-01-12 NOTE — Therapy (Signed)
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 Girtha Rm [facility]), in remission ==> rec yearly CBC, CMP, CPE, and echo Q30yr - [facility] (see note dated [facility])   Arthritis    pt states she does not have arthritis   Past Surgical History:  Procedure Laterality Date   MYRINGOTOMY  2005   Bilateral   PORT-A-CATH REMOVAL  2010   PORTACATH PLACEMENT  2007   for chemo   TUMOR REMOVAL  2008   benign; behind left ear   Patient Active Problem List   Diagnosis Date Noted   Overweight with body mass index (BMI) of 29 to 29.9 in adult 09/16/2022   History of acute lymphoblastic leukemia (ALL) 10/27/2021    PCP: RWebb Silversmith NP  REFERRING PROVIDER: MCelesta Gentile DPM  REFERRING DIAG: [phone] THERAPY DIAG:  Right ankle instability  Ankle weakness  Stiffness of right ankle, not elsewhere classified  Rationale for Evaluation and Treatment: Rehabilitation  ONSET DATE: chronic, surgery was 11/17/22  SUBJECTIVE:   SUBJECTIVE STATEMENT: 11/17/22 pt had surgery on R ankle for ATFL repair, microfx, removal of loose body via arthroscopic surgery.   H/o multiple sprains leading to chronic swelling and pain.  She is currently PWB in a CAM walking boot.  F/u January 18, 2023.  Currently she reports her anlke is feeling better.  She is noticing "tightness" in R lateral  ankle.  She wears the boot and uses b/l crutches when walking PWB.  Not wearing boot with sleeping or driving.    PERTINENT HISTORY: Pt played basketball in high school.   PAIN:  Are you having pain? No  PRECAUTIONS: Other: PWB in CAM walking boot, can progress to WBAT in boot  WEIGHT BEARING RESTRICTIONS: Yes PWB  FALLS:  Has patient fallen in last 6 months? 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    NEXT MD VISIT: 01/18/23  OBJECTIVE:   DIAGNOSTIC FINDINGS: MRI before surgery (see chart review)  PATIENT SURVEYS:  FOTO 37/67  COGNITION: Overall cognitive status: Within functional limits for tasks assessed     SENSATION: WFL  EDEMA:  Figure 8 R and L 49.5  OBSERVATION: pt's lateral incision site is still healing at distal end  POSTURE: pt arrived wearing CAM walking boot and using b/l axillary crutches  PALPATION: Mild typical swelling noted along  R lateral ankle/malleolar region  LOWER EXTREMITY ROM:  Active ROM Right eval Left eval  Ankle dorsiflexion 5 12  Ankle plantarflexion 40 55  Ankle inversion    Ankle eversion     (Blank rows = not tested)  LOWER EXTREMITY MMT:  MMT Right eval Left eval  Ankle dorsiflexion 5 3+  Ankle plantarflexion 5 2   Ankle inversion 5 3+  Ankle eversion 5 3+   (Blank rows = not tested) tested as a "isometric make test" in neutral position of ankle    TODAY'S TREATMENT:                                                                                                                              DATE: 01/10/23   Subjective:  Pt  has not needed to use a crutch since her last PT session.  She did shoot some hoops with her sister in the driveway yesterday and her ankle is very sore today.  Pain level 4/10   There.ex.:  Nustep L5 10 min. Achieved 1/2 mile.   B UE/LE (good R ankle control/ position).   Walking outside on flat surface, incline surface, grass surface forward/reverse: x 10 min, PT cues to "push off", she did begin developing an antalgic gait towards the end of 10 min.  Forward step ups: 2x10  Lateral step downs R LE: 2x10  Medial step ups R LE: 2x10  Supine R ankle stretches: DF/PF/IV/EV 3x each with static holds.    Supine R ankle manual isometrics (min to mod. resistance)- 5x 5 sec. Holds (no pain).  Limited R ankle EV.  (+) swelling noted along anterior ankle (medial to her large incision site) upon arrival, still present at end of session.  Ice pack applied at end of session today x 10 min to address pain/swelling   Discussed HEP/ no changes to HEP   PATIENT EDUCATION:  Education details: HEP, PT POC/goals, reviewed WB precautions Person educated: Patient Education method: Explanation, Demonstration, and Handouts Education comprehension: verbalized understanding and needs further education  HOME EXERCISE PROGRAM: Access Code: [email or web address] URL: [email or web address] Date: 01/05/2023 Prepared by: Merdis Delay  Exercises - Sidelying Hip Abduction  - 1 x daily - 7 x weekly - 2 sets - 20 reps - Prone Hip Extension  - 1 x daily - 7 x weekly - 2 sets - 20 reps - Bridge with Hip Abduction and Resistance - Ground Touches  - 1 x daily - 7 x weekly - 2 sets - 10 reps - Long Sitting Calf Stretch with Strap  - 2 x daily - 7 x weekly - 5 reps - 20 hold - Standing Heel Raise with Support  - 1 x daily - 7 x weekly - 2 sets - 10 reps  ASSESSMENT:  CLINICAL IMPRESSION: Pt arrived today flared up after overdoing it at home yesterday playing recreational basketball in her driveway with  her sister.  She notes 4/10 pain and  mild swelling noted along R anterior ankle.  Discussed importance of gradually increasing activity as she continues to build strength/proprioception/weight bearing endurance with PT to avoid over straining surgical repair.  Ended with cold pack for swelling/pain control.  She is an appropriate candidate for skilled PT to address the impairments listed below  OBJECTIVE IMPAIRMENTS: decreased activity tolerance, decreased balance, decreased endurance, decreased mobility, difficulty walking, decreased ROM, decreased strength, and pain.   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   REHAB POTENTIAL: Excellent  CLINICAL DECISION MAKING: Stable/uncomplicated  EVALUATION COMPLEXITY: Low   GOALS: Goals reviewed with patient? Yes  SHORT TERM GOALS: Target date: 01/20/23 (by her follow up with physician) Pt will be able to amb with normal R heel strike/toe off in sneaker WBAT Baseline: initial eval PWB in CAM boot  Goal status: INITIAL  2.  Improve ankle DF R to >10 deg to facilitate normal gait pattern on flat surfaces  Baseline: initial eval 5 degrees Goal status: INITIAL    LONG TERM GOALS: Target date: 02/23/23  Improve R ankle strength to 4/5 to allow for CKC single leg stance dynamic strengthening in preparation for return to PLOF Baseline: currently PWB at initial eval Goal status: INITIAL  2.  Improve FOTO to >50 indicating improved ability to return to PLOF Baseline: 37 (target expected 67) Goal status: INITIAL  3.  Pt will be able to amb on flat/uneven surfaces without any AD x 20 min without  any increase in swelling in preparation for higher level sports specific rehab Baseline: currently PWB Goal status: INITIAL    PLAN:  PT FREQUENCY: 2x/week  PT DURATION: 8 weeks  PLANNED INTERVENTIONS: Therapeutic exercises, Therapeutic activity, Neuromuscular re-education, Balance training, Gait training, Patient/Family education, Self Care, and Joint mobilization  PLAN FOR NEXT SESSION: Reassess gait pattern.  Merdis Delay, PT, DPT, OCS  [phone]  01/12/2023, 5:51 PM

## 2023-01-13 ENCOUNTER — Encounter: Payer: Self-pay | Admitting: Podiatry

## 2023-01-18 ENCOUNTER — Encounter: Payer: 59 | Admitting: Podiatry

## 2023-01-19 ENCOUNTER — Ambulatory Visit: Payer: 59

## 2023-01-19 DIAGNOSIS — R29898 Other symptoms and signs involving the musculoskeletal system: Secondary | ICD-10-CM | POA: Diagnosis not present

## 2023-01-19 DIAGNOSIS — M25371 Other instability, right ankle: Secondary | ICD-10-CM | POA: Diagnosis not present

## 2023-01-19 DIAGNOSIS — M25671 Stiffness of right ankle, not elsewhere classified: Secondary | ICD-10-CM | POA: Diagnosis not present

## 2023-01-19 NOTE — Therapy (Signed)
OUTPATIENT PHYSICAL THERAPY LOWER EXTREMITY TREATMENT   Patient Name: Robin Harris MRN: [id number] DOB:[birth year], 22 y.o., female Today's Date: 01/19/2023  END OF SESSION:  PT End of Session - 01/19/23 1510     Visit Number 6    Number of Visits 16    Date for PT Re-Evaluation 02/23/23    Authorization Type 2x/week for 8 weeks    PT Start Time 1500    PT Stop Time 1545    PT Time Calculation (min) 45 min    Activity Tolerance Patient tolerated treatment well    Behavior During Therapy [facility] for tasks assessed/performed             Past Medical History:  Diagnosis Date   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 Girtha Rm [facility]), in remission ==> rec yearly CBC, CMP, CPE, and echo Q46yrs - and plz fax to [facility] peds onc (see note dated 10/2012)   Arthritis    pt states she does not have arthritis   Past Surgical History:  Procedure Laterality Date   MYRINGOTOMY  2005   Bilateral   PORT-A-CATH REMOVAL  2010   PORTACATH PLACEMENT  2007   for chemo   TUMOR REMOVAL  2008   benign; behind left ear   Patient Active Problem List   Diagnosis Date Noted   Overweight with body mass index (BMI) of 29 to 29.9 in adult 09/16/2022   History of acute lymphoblastic leukemia (ALL) 10/27/2021    PCP: Webb Silversmith, NP  REFERRING PROVIDER: Celesta Gentile, DPM  REFERRING DIAG: [phone]  THERAPY DIAG:  Right ankle instability  Ankle weakness  Stiffness of right ankle, not elsewhere classified  Rationale for Evaluation and Treatment: Rehabilitation  ONSET DATE: chronic, surgery was 11/17/22  SUBJECTIVE:   SUBJECTIVE STATEMENT: 11/17/22 pt had surgery on R ankle for ATFL repair, microfx, removal of loose body via arthroscopic surgery.   H/o multiple sprains leading to chronic swelling and pain.  She is currently PWB in a CAM walking boot.  F/u January 18, 2023.  Currently she reports her anlke is feeling better.  She is noticing "tightness" in R lateral  ankle.  She wears the boot and uses b/l crutches when walking PWB.  Not wearing boot with sleeping or driving.    PERTINENT HISTORY: Pt played basketball in high school.   PAIN:  Are you having pain? No  PRECAUTIONS: Other: PWB in CAM walking boot, can progress to WBAT in boot  WEIGHT BEARING RESTRICTIONS: Yes PWB  FALLS:  Has patient fallen in last 6 months? 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    NEXT MD VISIT: 01/18/23  OBJECTIVE:   DIAGNOSTIC FINDINGS: MRI before surgery (see chart review)  PATIENT SURVEYS:  FOTO 37/67  COGNITION: Overall cognitive status: Within functional limits for tasks assessed     SENSATION: WFL  EDEMA:  Figure 8 R and L 49.5  OBSERVATION: pt's lateral incision site is still healing at distal end  POSTURE: pt arrived wearing CAM walking boot and using b/l axillary crutches  PALPATION: Mild typical swelling noted along  R lateral ankle/malleolar region  LOWER EXTREMITY ROM:  Active ROM Right eval Left eval  Ankle dorsiflexion 5 12  Ankle plantarflexion 40 55  Ankle inversion    Ankle eversion     (Blank rows = not tested)  LOWER EXTREMITY MMT:  MMT Right eval Left eval  Ankle dorsiflexion 5 3+  Ankle plantarflexion 5 2   Ankle inversion 5 3+  Ankle eversion 5 3+   (Blank rows = not tested) tested as a "isometric make test" in neutral position of ankle    TODAY'S TREATMENT:                                                                                                                              DATE: 01/10/23   Subjective:  Pt  postponed her next post-op appointment.  She reports her ankle has been sore.  She was on a boat fishing with her boyfriend this weekend and noticed she had difficulty maintaining her balance in the boat.  No swelling that she notes.    Pain level 4/10   Therapeutic Exercise:  Scifit L5 10 min Standing gastroc stretch: 30 seconds x 3 MREs: DF x15, Eversion x15 Standing heel raises 2x12 (bilateral) Total gym leg press: 2x15 (level 22) Total gym heel raises/plantar flexion: 2x8 Wobble board A/P: bilateral LE stance 30 seconds x 3, 1 min ball toss with PT at various angles Wobble board med/lateral: bilateral LE stance 30 seconds x3, 1 min ball toss with PT  Manual Therapy: DF MWM 3x8 Manual DF stretch- gastroc and soleus: 3x30 sec each  Ice pack applied at end of session today x 10 min to address pain/swelling   Discussed HEP/ added standing gastroc stretch and ankle DF with resistance to HEP   PATIENT EDUCATION:  Education details: HEP, PT POC/goals, reviewed WB precautions Person educated: Patient Education method: Explanation, Demonstration, and Handouts Education comprehension: verbalized understanding and needs further education  HOME EXERCISE PROGRAM: Access Code: [email or web address] URL: [email or web address] Date: 01/19/2023 Prepared by: Merdis Delay  Exercises - Sidelying Hip Abduction  - 1 x daily - 7 x weekly - 2 sets - 20 reps - Prone Hip Extension  - 1 x daily - 7 x weekly - 2 sets - 20 reps - Bridge with Hip Abduction and Resistance - Ground Touches  - 1 x daily - 7 x weekly - 2 sets - 10 reps - Standing Heel Raise with Support  - 1 x daily - 7 x weekly - 2 sets - 10 reps - Standing Gastroc Stretch  - 1 x daily - 7 x weekly - 3 reps - 30 hold - Ankle Dorsiflexion with Resistance  - 1 x daily - 7 x weekly - 3 sets - 10 reps  ASSESSMENT:  CLINICAL IMPRESSION: Progressed b/l proprioception and strengthening exercises for ankle today.  Pt still lacks end  range DF AROM and this is contributing to shortened step length on non surgical leg.  Added gastroc/soleus stretching in standing and continued with ankle DF MWM to address DF ROM impairment.  Ended with cold pack for swelling/pain control.  She is an appropriate candidate for skilled PT to address the impairments listed below  OBJECTIVE IMPAIRMENTS: decreased activity tolerance, decreased balance, decreased endurance, decreased mobility, difficulty walking, decreased ROM, decreased strength, and pain.   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   REHAB POTENTIAL: Excellent  CLINICAL DECISION MAKING: Stable/uncomplicated  EVALUATION COMPLEXITY: Low   GOALS: Goals reviewed with patient? Yes  SHORT TERM GOALS: Target date: 01/20/23 (by her follow up with physician) Pt will be able to amb with normal R heel strike/toe off in sneaker WBAT Baseline: initial eval PWB in CAM boot  Goal status: INITIAL  2.  Improve ankle DF R to >10 deg to facilitate normal gait pattern on flat surfaces  Baseline: initial eval 5 degrees Goal status: INITIAL    LONG TERM GOALS: Target date: 02/23/23  Improve R ankle strength to 4/5 to allow for CKC single leg stance dynamic strengthening in preparation for return to PLOF Baseline: currently PWB at initial eval Goal status: INITIAL  2.  Improve FOTO to >50 indicating improved ability to return to PLOF Baseline: 37 (target expected 67) Goal status: INITIAL  3.  Pt will be able to amb on flat/uneven surfaces without any AD x 20 min without any increase in swelling in preparation for higher level sports  specific rehab Baseline: currently PWB Goal status: INITIAL    PLAN:  PT FREQUENCY: 2x/week  PT DURATION: 8 weeks  PLANNED INTERVENTIONS: Therapeutic exercises, Therapeutic activity, Neuromuscular re-education, Balance training, Gait training, Patient/Family education, Self Care, and Joint mobilization  PLAN FOR NEXT SESSION: double leg balance on wobble board, begin single leg balance work next visit; continue with leg press, calf raises, gaining full DF ROM  Merdis Delay, PT, DPT, OCS  [phone]  01/19/2023, 5:12 PM

## 2023-01-21 ENCOUNTER — Ambulatory Visit: Payer: 59 | Admitting: Physical Therapy

## 2023-01-21 ENCOUNTER — Encounter: Payer: Self-pay | Admitting: Physical Therapy

## 2023-01-21 DIAGNOSIS — M25371 Other instability, right ankle: Secondary | ICD-10-CM

## 2023-01-21 DIAGNOSIS — R29898 Other symptoms and signs involving the musculoskeletal system: Secondary | ICD-10-CM

## 2023-01-21 DIAGNOSIS — M25671 Stiffness of right ankle, not elsewhere classified: Secondary | ICD-10-CM

## 2023-01-21 NOTE — Therapy (Signed)
OUTPATIENT PHYSICAL THERAPY LOWER EXTREMITY TREATMENT   Patient Name: Robin Harris MRN: [id number] DOB:31-May-2001, 22 y.o., female Today's Date: 01/21/2023  END OF SESSION:  PT End of Session - 01/21/23 0945     Visit Number 7    Number of Visits 16    Date for PT Re-Evaluation 02/23/23    Authorization Type 2x/week for 8 weeks    PT Start Time 0945    PT Stop Time 1032    PT Time Calculation (min) 47 min    Activity Tolerance Patient tolerated treatment well    Behavior During Therapy 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 Girtha [facility]), in remission ==> rec yearly CBC, CMP, CPE, and echo Q16yrs - and plz fax to [facility] peds onc (see note dated 10/2012)   Arthritis    pt states she does not have arthritis   Past Surgical History:  Procedure Laterality Date   MYRINGOTOMY  2005   Bilateral   PORT-A-CATH REMOVAL  2010   PORTACATH PLACEMENT  2007   for chemo   TUMOR REMOVAL  2008   benign; behind left ear   Patient Active Problem List   Diagnosis Date Noted   Overweight with body mass index (BMI) of 29 to 29.9 in adult 09/16/2022   History of acute lymphoblastic leukemia (ALL) 10/27/2021    PCP: Webb Silversmith, NP  REFERRING PROVIDER: Celesta Gentile, DPM  REFERRING DIAG: [phone]  THERAPY DIAG:  Right ankle instability  Ankle weakness  Stiffness of right ankle, not elsewhere classified  Rationale for Evaluation and Treatment: Rehabilitation  ONSET DATE: chronic, surgery was 11/17/22  SUBJECTIVE:   SUBJECTIVE STATEMENT: 11/17/22 pt had surgery on R ankle for ATFL repair, microfx, removal of loose body via arthroscopic surgery.   H/o multiple sprains leading to chronic swelling and pain.  She is currently PWB in a CAM walking boot.  F/u January 18, 2023.  Currently she reports her anlke is feeling better.  She is noticing "tightness" in R lateral  ankle.  She wears the boot and uses b/l crutches when walking PWB.  Not wearing boot with sleeping or driving.    PERTINENT HISTORY: Pt played basketball in high school.   PAIN:  Are you having pain? No  PRECAUTIONS: Other: PWB in CAM walking boot, can progress to WBAT in boot  WEIGHT BEARING RESTRICTIONS: Yes PWB  FALLS:  Has patient fallen in last 6 months? 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    NEXT MD VISIT: 01/18/23  OBJECTIVE:   DIAGNOSTIC FINDINGS: MRI before surgery (see chart review)  PATIENT SURVEYS:  FOTO 37/67  COGNITION: Overall cognitive status: Within functional limits for tasks assessed     SENSATION: WFL  EDEMA:  Figure 8 R and L 49.5  OBSERVATION: pt's lateral incision site is still healing at distal end  POSTURE: pt arrived wearing CAM walking boot and using b/l axillary crutches  PALPATION: Mild typical swelling noted along  R lateral ankle/malleolar region  LOWER EXTREMITY ROM:  Active ROM Right eval Left eval  Ankle dorsiflexion 5 12  Ankle plantarflexion 40 55  Ankle inversion    Ankle eversion     (Blank rows = not tested)  LOWER EXTREMITY MMT:  MMT Right eval Left eval  Ankle dorsiflexion 5 3+  Ankle plantarflexion 5 2   Ankle inversion 5 3+  Ankle eversion 5 3+   (Blank rows = not tested) tested as a "isometric make test" in neutral position of ankle   TODAY'S TREATMENT:                                                                                                                              DATE: 01/21/2023   Subjective:   Pt. Entered PT with no new complaints.  Pt. Wearing sneakers and entered PT with more normalized gait pattern.    Therapeutic Exercise:  Scifit L6 B UE/LE 10 min.  Discussed daily activity/ exercise.   Prostretch in //-bars: 4x with static holds 20-30 sec.  "Feels good"  Total gym leg press: 2x15 (level 22) Total gym heel raises/gastroc stretches: 20x  Walking in clinic/ hallway with consistent recip. Gait pattern with focus on heel strike/ toe off and equal wt. Bearing.  Use of mirror for feedback/ correction of gait.   Rebounder: Tandem/ SLS/ Airex (NBOS/ tandem/ SLS)/ Reverse BOSU (wt. Shifting).  10 throws each with SBA from PT for safety.    Supine manual DF stretch- gastroc and soleus: 3x30 sec each  Reassessment of R ankle AROM/ stretches (all planes).  Manual isometrics: PF/DF/IV/EV 5x each (moderate resistance).  Discussed importance of scar massage.    Pt. Will ice at home.     PATIENT EDUCATION:  Education details: HEP, PT POC/goals, reviewed WB precautions Person educated: Patient Education method: Explanation, Demonstration, and Handouts Education comprehension: verbalized understanding and needs further education  HOME EXERCISE PROGRAM: Access Code: [email or web address] URL: [email or web address] Date: 01/19/2023 Prepared by: Merdis Delay  Exercises - Sidelying Hip Abduction  - 1 x daily - 7 x weekly - 2 sets - 20 reps - Prone Hip Extension  - 1 x daily - 7 x weekly - 2 sets - 20 reps - Bridge with Hip Abduction and Resistance - Ground Touches  - 1 x daily - 7 x weekly - 2 sets - 10 reps - Standing Heel Raise with Support  - 1 x daily - 7 x weekly - 2 sets - 10 reps - Standing Gastroc Stretch  - 1 x daily - 7 x weekly - 3 reps - 30 hold - Ankle Dorsiflexion with Resistance  - 1 x daily - 7 x weekly - 3 sets - 10 reps  ASSESSMENT:  CLINICAL IMPRESSION: Progressed b/l proprioception and strengthening exercises for ankle today.  Pt still lacks end range DF  AROM and this is contributing to shortened step length on non surgical leg.  Reviewed gastroc/soleus stretching in  standing and continued with ankle manual stretches.  Pts. Ankle stability challenged with Airex pad/ BOSU during rebounder ex.  She is an appropriate candidate for skilled PT to address the impairments listed below  OBJECTIVE IMPAIRMENTS: decreased activity tolerance, decreased balance, decreased endurance, decreased mobility, difficulty walking, decreased ROM, decreased strength, and pain.   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   REHAB POTENTIAL: Excellent  CLINICAL DECISION MAKING: Stable/uncomplicated  EVALUATION COMPLEXITY: Low   GOALS: Goals reviewed with patient? Yes  SHORT TERM GOALS: Target date: 01/20/23 (by her follow up with physician) Pt will be able to amb with normal R heel strike/toe off in sneaker WBAT Baseline: initial eval PWB in CAM boot.  3/22: wearing sneaker with more normalized gait pattern/ decrease DF  Goal status: Partially met  2.  Improve ankle DF R to >10 deg to facilitate normal gait pattern on flat surfaces  Baseline: initial eval 5 degrees Goal status: INITIAL    LONG TERM GOALS: Target date: 02/23/23  Improve R ankle strength to 4/5 to allow for CKC single leg stance dynamic strengthening in preparation for return to PLOF Baseline: currently PWB at initial eval Goal status: INITIAL  2.  Improve FOTO to >50 indicating improved ability to return to PLOF Baseline: 37 (target expected 67) Goal status: INITIAL  3.  Pt will be able to amb on flat/uneven surfaces without any AD x 20 min  without any increase in swelling in preparation for higher level sports specific rehab Baseline: currently PWB Goal status: INITIAL    PLAN:  PT FREQUENCY: 2x/week  PT DURATION: 8 weeks  PLANNED INTERVENTIONS: Therapeutic exercises, Therapeutic activity, Neuromuscular re-education, Balance training, Gait training, Patient/Family education, Self Care, and Joint mobilization  PLAN FOR NEXT SESSION: double leg balance on wobble board, begin single leg balance work next visit; continue with leg press, calf raises, gaining full DF ROM  Pura Spice, PT, DPT # [phone] 01/21/2023, 8:21 PM

## 2023-01-24 ENCOUNTER — Ambulatory Visit: Payer: 59 | Admitting: Physical Therapy

## 2023-01-24 ENCOUNTER — Encounter: Payer: Self-pay | Admitting: Physical Therapy

## 2023-01-24 DIAGNOSIS — M25371 Other instability, right ankle: Secondary | ICD-10-CM

## 2023-01-24 DIAGNOSIS — M25671 Stiffness of right ankle, not elsewhere classified: Secondary | ICD-10-CM

## 2023-01-24 DIAGNOSIS — R29898 Other symptoms and signs involving the musculoskeletal system: Secondary | ICD-10-CM | POA: Diagnosis not present

## 2023-01-24 NOTE — Therapy (Addendum)
OUTPATIENT PHYSICAL THERAPY LOWER EXTREMITY TREATMENT  Patient Name: Robin Harris MRN: [id number] DOB:11-28-2000, 22 y.o., female Today's Date: 01/24/2023  END OF SESSION:  PT End of Session - 01/24/23 0729     Visit Number 8    Number of Visits 16    Date for PT Re-Evaluation 02/23/23    Authorization Type 2x/week for 8 weeks    PT Start Time 0811    PT Stop Time 0906    PT Time Calculation (min) 55 min    Activity Tolerance Patient tolerated treatment well    Behavior During Therapy 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 Girtha [facility]), in remission ==> rec yearly CBC, CMP, CPE, and echo Q29yrs - and plz fax to [facility] peds onc (see note dated 10/2012)   Arthritis    pt states she does not have arthritis   Past Surgical History:  Procedure Laterality Date   MYRINGOTOMY  2005   Bilateral   PORT-A-CATH REMOVAL  2010   PORTACATH PLACEMENT  2007   for chemo   TUMOR REMOVAL  2008   benign; behind left ear   Patient Active Problem List   Diagnosis Date Noted   Overweight with body mass index (BMI) of 29 to 29.9 in adult 09/16/2022   History of acute lymphoblastic leukemia (ALL) 10/27/2021    PCP: Webb Silversmith, NP  REFERRING PROVIDER: Celesta Gentile, DPM  REFERRING DIAG: [phone]  THERAPY DIAG:  Right ankle instability  Ankle weakness  Stiffness of right ankle, not elsewhere classified  Rationale for Evaluation and Treatment: Rehabilitation  ONSET DATE: chronic, surgery was 11/17/22  SUBJECTIVE:   SUBJECTIVE STATEMENT: 11/17/22 pt had surgery on R ankle for ATFL repair, microfx, removal of loose body via arthroscopic surgery.   H/o multiple sprains leading to chronic swelling and pain.  She is currently PWB in a CAM walking boot.  F/u January 18, 2023.  Currently she reports her anlke is feeling better.  She is noticing "tightness" in R lateral  ankle.  She wears the boot and uses b/l crutches when walking PWB.  Not wearing boot with sleeping or driving.    PERTINENT HISTORY: Pt played basketball in high school.   PAIN:  Are you having pain? No  PRECAUTIONS: Other: PWB in CAM walking boot, can progress to WBAT in boot  WEIGHT BEARING RESTRICTIONS: Yes PWB  FALLS:  Has patient fallen in last 6 months? 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    NEXT MD VISIT: 01/18/23  OBJECTIVE:   DIAGNOSTIC FINDINGS: MRI before surgery (see chart review)  PATIENT SURVEYS:  FOTO 37/67  COGNITION: Overall cognitive status: Within functional limits for tasks assessed     SENSATION: WFL  EDEMA:  Figure 8 R and L 49.5  OBSERVATION: pt's lateral incision site is still healing at distal end  POSTURE: pt arrived wearing CAM walking boot and using b/l axillary crutches  PALPATION: Mild typical swelling noted along R  lateral ankle/malleolar region  LOWER EXTREMITY ROM:  Active ROM Right eval Left eval  Ankle dorsiflexion 5 12  Ankle plantarflexion 40 55  Ankle inversion    Ankle eversion     (Blank rows = not tested)  LOWER EXTREMITY MMT:  MMT Right eval Left eval  Ankle dorsiflexion 5 3+  Ankle plantarflexion 5 2   Ankle inversion 5 3+  Ankle eversion 5 3+   (Blank rows = not tested) tested as a "isometric make test" in neutral position of ankle   TODAY'S TREATMENT:                                                                                                                              DATE: 01/24/2023   Subjective:   Pt. Entered PT with no new complaints.  Pt. Wearing sneakers and entered PT with more normalized gait pattern.  Pt. Reports scar is sore over R lateral ankle.  Pt. Reports compliance with HEP.   Pt. States she needs a note to return to work at [employer] in [place] by this Wednesday.    Therapeutic Exercise:  Scifit L8-7 B UE/LE 10 min.  Discussed daily activity/ exercise.    Rebounder: Tandem/ SLS/ Airex (NBOS/ tandem/ SLS)/ Reverse BOSU (wt. Shifting/ partial squats).  10 throws each with SBA from PT for safety.    Prostretch in //-bars: 5x with static holds 20-30 sec.    BOSU step ups forward/ lateral 10x each with no UE assist.    Total gym leg press: bilateral 20x/ single leg 15x (level 22) Total gym heel raises/gastroc stretches: 20x  Walking in clinic/ hallway with consistent recip. Gait pattern with focus on heel strike/ toe off and equal wt. Bearing.  Pt. Instructed to increase cadence for several laps in hallway (slight increase in ankle discomfort, not pain).    Supine manual DF/IV/EV stretches: 3x30 sec each  Supine manual isometrics: PF/DF/IV/EV 5x each (moderate resistance).  Discussed importance of scar massage.  Pt. C/o soreness over scar.     Ice to R ankle after tx. In supine position.     PATIENT EDUCATION:  Education details: HEP, PT POC/goals, reviewed WB precautions Person educated: Patient Education method: Explanation, Demonstration, and Handouts Education comprehension: verbalized understanding and needs further education  HOME EXERCISE PROGRAM: Access Code: [email or web address] URL: [email or web address] Date: 01/19/2023 Prepared by: Merdis Delay  Exercises - Sidelying Hip Abduction  - 1 x daily - 7 x weekly - 2 sets - 20 reps - Prone Hip Extension  - 1 x daily - 7 x weekly - 2 sets - 20 reps - Bridge with Hip Abduction and Resistance - Ground Touches  - 1 x daily - 7 x weekly - 2 sets - 10 reps - Standing Heel Raise with Support  - 1 x  daily - 7 x weekly - 2 sets - 10 reps - Standing Gastroc Stretch  - 1 x daily - 7 x weekly -  3 reps - 30 hold - Ankle Dorsiflexion with Resistance  - 1 x daily - 7 x weekly - 3 sets - 10 reps  ASSESSMENT:  CLINICAL IMPRESSION: Pt lacks end range DF AROM in R ankle as compared to L and this is contributing to shortened step length on non surgical leg.  Pt. Progressing well with R ankle stability ex. On TG and rebounder tasks.  Pts. Ankle stability challenged with Airex pad/ BOSU during rebounder ex.  PT wrote note for pt. To return to job demands without restriction.  Pt. Understands importance of wearing supportive shoes and avoiding pain provoking movement patterns at work/ home.  She is an appropriate candidate for skilled PT to address the impairments listed below  OBJECTIVE IMPAIRMENTS: decreased activity tolerance, decreased balance, decreased endurance, decreased mobility, difficulty walking, decreased ROM, decreased strength, and pain.   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   REHAB POTENTIAL: Excellent  CLINICAL DECISION MAKING: Stable/uncomplicated  EVALUATION COMPLEXITY: Low   GOALS: Goals reviewed with patient? Yes  SHORT TERM GOALS: Target date: 01/20/23 (by her follow up with physician) Pt will be able to amb with normal R heel strike/toe off in sneaker WBAT Baseline: initial eval PWB in CAM boot.  3/22: wearing sneaker with more normalized gait pattern/ decrease DF  Goal status: Partially met  2.  Improve ankle DF R to >10 deg to facilitate normal gait pattern on flat surfaces  Baseline: initial eval 5 degrees Goal  status: INITIAL    LONG TERM GOALS: Target date: 02/23/23  Improve R ankle strength to 4/5 to allow for CKC single leg stance dynamic strengthening in preparation for return to PLOF Baseline: currently PWB at initial eval Goal status: INITIAL  2.  Improve FOTO to >50 indicating improved ability to return to PLOF Baseline: 37 (target expected 67) Goal status: INITIAL  3.  Pt will be able to amb on flat/uneven surfaces without any AD x 20 min without any increase in swelling in preparation for higher level sports specific rehab Baseline: currently PWB Goal status: INITIAL    PLAN:  PT FREQUENCY: 2x/week  PT DURATION: 8 weeks  PLANNED INTERVENTIONS: Therapeutic exercises, Therapeutic activity, Neuromuscular re-education, Balance training, Gait training, Patient/Family education, Self Care, and Joint mobilization  PLAN FOR NEXT SESSION: double leg balance on wobble board, begin single leg balance work next visit; continue with leg press, calf raises, gaining full DF ROM.  CHECK FOTO  Pura Spice, PT, DPT # [phone] 01/24/2023, 8:02 PM

## 2023-01-31 ENCOUNTER — Ambulatory Visit: Payer: 59 | Attending: Internal Medicine

## 2023-01-31 DIAGNOSIS — R29898 Other symptoms and signs involving the musculoskeletal system: Secondary | ICD-10-CM | POA: Insufficient documentation

## 2023-01-31 DIAGNOSIS — M25671 Stiffness of right ankle, not elsewhere classified: Secondary | ICD-10-CM | POA: Insufficient documentation

## 2023-01-31 DIAGNOSIS — M25371 Other instability, right ankle: Secondary | ICD-10-CM | POA: Insufficient documentation

## 2023-02-01 ENCOUNTER — Other Ambulatory Visit: Payer: Self-pay

## 2023-02-02 ENCOUNTER — Ambulatory Visit: Payer: 59

## 2023-02-02 DIAGNOSIS — R29898 Other symptoms and signs involving the musculoskeletal system: Secondary | ICD-10-CM | POA: Diagnosis not present

## 2023-02-02 DIAGNOSIS — M25671 Stiffness of right ankle, not elsewhere classified: Secondary | ICD-10-CM

## 2023-02-02 DIAGNOSIS — M25371 Other instability, right ankle: Secondary | ICD-10-CM | POA: Diagnosis not present

## 2023-02-02 NOTE — Therapy (Signed)
OUTPATIENT PHYSICAL THERAPY LOWER EXTREMITY TREATMENT  Patient Name: Robin Harris MRN: [id number] DOB:2001-03-07, 22 y.o., female Today's Date: 02/02/2023  END OF SESSION:  PT End of Session - 02/02/23 1510     Visit Number 9    Number of Visits 16    Date for PT Re-Evaluation 02/23/23    Authorization Type 2x/week for 8 weeks    PT Start Time 1500    PT Stop Time 1550    PT Time Calculation (min) 50 min    Activity Tolerance Patient tolerated treatment well    Behavior During Therapy [facility] for tasks assessed/performed             Past Medical History:  Diagnosis Date   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 Girtha [facility]), in remission ==> rec yearly CBC, CMP, CPE, and echo Q79yrs - and plz fax to [facility] peds onc (see note dated 10/2012)   Arthritis    pt states she does not have arthritis   Past Surgical History:  Procedure Laterality Date   MYRINGOTOMY  2005   Bilateral   PORT-A-CATH REMOVAL  2010   PORTACATH PLACEMENT  2007   for chemo   TUMOR REMOVAL  2008   benign; behind left ear   Patient Active Problem List   Diagnosis Date Noted   Overweight with body mass index (BMI) of 29 to 29.9 in adult 09/16/2022   History of acute lymphoblastic leukemia (ALL) 10/27/2021    PCP: Webb Silversmith, NP  REFERRING PROVIDER: Celesta Gentile, DPM  REFERRING DIAG: [phone]  THERAPY DIAG:  Right ankle instability  Ankle weakness  Stiffness of right ankle, not elsewhere classified  Rationale for Evaluation and Treatment: Rehabilitation  ONSET DATE: chronic, surgery was 11/17/22  SUBJECTIVE:   SUBJECTIVE STATEMENT: 11/17/22 pt had surgery on R ankle for ATFL repair, microfx, removal of loose body via arthroscopic surgery.   H/o multiple sprains leading to chronic swelling and pain.  She is currently PWB in a CAM walking boot.  F/u January 18, 2023.  Currently she reports her anlke is feeling better.  She is noticing "tightness" in R lateral  ankle.  She wears the boot and uses b/l crutches when walking PWB.  Not wearing boot with sleeping or driving.    PERTINENT HISTORY: Pt played basketball in high school.   PAIN:  Are you having pain? No  PRECAUTIONS: Other: PWB in CAM walking boot, can progress to WBAT in boot  WEIGHT BEARING RESTRICTIONS: Yes PWB  FALLS:  Has patient fallen in last 6 months? 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    NEXT MD VISIT: 01/18/23  OBJECTIVE:   DIAGNOSTIC FINDINGS: MRI before surgery (see chart review)  PATIENT SURVEYS:  FOTO 37/67  COGNITION: Overall cognitive status: Within functional limits for tasks assessed     SENSATION: WFL  EDEMA:  Figure 8 R and L 49.5  OBSERVATION: pt's lateral incision site is still healing at distal end  POSTURE: pt arrived wearing CAM walking boot and using b/l axillary crutches  PALPATION: Mild typical swelling noted along R  lateral ankle/malleolar region  LOWER EXTREMITY ROM:  Active ROM Right eval Left eval  Ankle dorsiflexion 5 12  Ankle plantarflexion 40 55  Ankle inversion    Ankle eversion     (Blank rows = not tested)  LOWER EXTREMITY MMT:  MMT Right eval Left eval  Ankle dorsiflexion 5 3+  Ankle plantarflexion 5 2   Ankle inversion 5 3+  Ankle eversion 5 3+   (Blank rows = not tested) tested as a "isometric make test" in neutral position of ankle   TODAY'S TREATMENT:                                                                                                                              DATE: 02/02/2023   Subjective:  Pt  walked 3 miles yesterday for work to measure a 5K course for work.  Today she walked 1 mile to create a walking path for work too.  Overall her ankle feels sore, no new specific complaints.       Therapeutic Exercise:  Scifit L8-7 B UE/LE 10 min.  Discussed daily activity/ exercise.    Rebounder: Tandem/ SLS/ Airex (NBOS/ tandem/ SLS)/ Reverse BOSU tandem and narrow BOS.  10 throws each with SBA from PT for safety.  3rd size med ball.  Prostretch in //-bars: 5x with static holds 20-30 sec.    BOSU step ups forward/ lateral 10x each with no UE assist.    Total gym leg press: bilateral 20x/ single leg 15x (level 22)  Standing Single leg heel raises R LE x20 with 2 fingers each hand on // bars.  Walking in clinic/ hallway with consistent recip. Gait pattern with focus on heel strike/ toe off and equal wt. Bearing.  Pt. Instructed to increase cadence for several laps in hallway (slight increase in ankle discomfort, not pain).    Supine manual DF/IV/EV stretches: 3x30 sec each  DF MWM with R foot on step x10  Supine manual isometrics: PF/DF/IV/EV 5x each (moderate resistance).    Scar massage x 5 min  Ice to R ankle after tx. In supine position.     PATIENT EDUCATION:  Education details: HEP, PT POC/goals, reviewed WB precautions Person educated: Patient Education method: Explanation, Demonstration, and Handouts Education comprehension: verbalized understanding and needs further education  HOME EXERCISE PROGRAM: Access Code: [email or web address] URL: [email or web address] Date: 01/19/2023 Prepared by: Merdis Delay  Exercises - Sidelying Hip Abduction  - 1 x daily - 7 x weekly - 2 sets - 20 reps - Prone Hip Extension  - 1 x daily - 7 x weekly - 2 sets - 20 reps - Bridge with Hip Abduction and Resistance - Ground Touches  - 1 x daily - 7 x weekly - 2 sets - 10 reps - Standing Heel Raise with Support  - 1 x daily - 7 x weekly - 2 sets - 10 reps - Standing Gastroc Stretch  -  1 x daily - 7  x weekly - 3 reps - 30 hold - Ankle Dorsiflexion with Resistance  - 1 x daily - 7 x weekly - 3 sets - 10 reps  ASSESSMENT:  CLINICAL IMPRESSION: Pt's gait mechanics are improved since last week, more symmetrical terminal stance on R this session.  Able to progress single leg heel raises from total gym to standard CKC position today.  Achieved 20 unilateral heel raises with some substitutions observed towards end (knee flexing).  Tolerated balance exercises well on uneven surfaces with various bases of support progressing in difficulty.  Tandem stance on bosu is still quite challenging.  She is an appropriate candidate for skilled PT to address the impairments listed below  OBJECTIVE IMPAIRMENTS: decreased activity tolerance, decreased balance, decreased endurance, decreased mobility, difficulty walking, decreased ROM, decreased strength, and pain.   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   REHAB POTENTIAL: Excellent  CLINICAL DECISION MAKING: Stable/uncomplicated  EVALUATION COMPLEXITY: Low   GOALS: Goals reviewed with patient? Yes  SHORT TERM GOALS: Target date: 01/20/23 (by her follow up with physician) Pt will be able to amb with normal R heel strike/toe off in sneaker WBAT Baseline: initial eval PWB in CAM boot.  3/22: wearing sneaker with more normalized gait pattern/ decrease DF; 4/3: met Goal status: MET  2.  Improve ankle DF R to >10 deg to facilitate normal gait pattern on flat surfaces  Baseline: initial eval 5 degrees; 4/3: 8 degrees Goal status: partially met    LONG TERM GOALS: Target date:  02/23/23  Improve R ankle strength to 4/5 to allow for CKC single leg stance dynamic strengthening in preparation for return to PLOF Baseline: currently PWB at initial eval Goal status: INITIAL  2.  Improve FOTO to >50 indicating improved ability to return to PLOF Baseline: 37 (target expected 67) Goal status: INITIAL  3.  Pt will be able to amb on flat/uneven surfaces without any AD x 20 min without any increase in swelling in preparation for higher level sports specific rehab Baseline: currently PWB Goal status: INITIAL    PLAN:  PT FREQUENCY: 2x/week  PT DURATION: 8 weeks  PLANNED INTERVENTIONS: Therapeutic exercises, Therapeutic activity, Neuromuscular re-education, Balance training, Gait training, Patient/Family education, Self Care, and Joint mobilization  PLAN FOR NEXT SESSION: double leg balance on wobble board, begin single leg balance work next visit; continue with leg press, calf raises, gaining full DF ROM.  CHECK [address] [place], [place], DPT, [place]  #[id number]  02/02/2023, 4:09 PM

## 2023-02-07 ENCOUNTER — Ambulatory Visit: Payer: 59

## 2023-02-07 DIAGNOSIS — M25371 Other instability, right ankle: Secondary | ICD-10-CM

## 2023-02-07 DIAGNOSIS — M25671 Stiffness of right ankle, not elsewhere classified: Secondary | ICD-10-CM | POA: Diagnosis not present

## 2023-02-07 DIAGNOSIS — R29898 Other symptoms and signs involving the musculoskeletal system: Secondary | ICD-10-CM

## 2023-02-07 NOTE — Therapy (Signed)
OUTPATIENT PHYSICAL THERAPY LOWER EXTREMITY TREATMENT  Patient Name: Robin Harris MRN: [id number] DOB:07/29/2001, 22 y.o., female Today's Date: 02/07/2023  END OF SESSION:  PT End of Session - 02/07/23 1509     Visit Number 10    Number of Visits 16    Date for PT Re-Evaluation 02/23/23    Authorization Type 2x/week for 8 weeks    PT Start Time 1500    PT Stop Time 1545    PT Time Calculation (min) 45 min    Activity Tolerance Patient tolerated treatment well    Behavior [facility] for tasks assessed/performed             Past Medical History:  Diagnosis Date   ALL (acute lymphoblastic leukemia) ([facility]) 2007   [facility] ==> rec yearly CBC, CMP, CPE, and echo Q6560yrs - [facility] (see note dated 10/2012)   Arthritis    pt states she does not have arthritis   Past Surgical History:  Procedure Laterality Date   MYRINGOTOMY  2005   Bilateral   PORT-A-CATH REMOVAL  2010   PORTACATH PLACEMENT  2007   for chemo   TUMOR REMOVAL  2008   benign; behind left ear   Patient Active Problem List   Diagnosis Date Noted   Overweight with body mass index (BMI) of 29 to 29.9 in adult 09/16/2022   History of acute lymphoblastic leukemia (ALL) 10/27/2021    PCP: Nicki Reaperegina Baity, NP  REFERRING PROVIDER: Ovid CurdMatthew Wagoner, DPM  REFERRING DIAG: [phone]  THERAPY DIAG:  Right ankle instability  Ankle weakness  Stiffness of right ankle, not elsewhere classified  Rationale for Evaluation and Treatment: Rehabilitation  ONSET DATE: chronic, surgery was 11/17/22  SUBJECTIVE:   SUBJECTIVE STATEMENT: 11/17/22 pt had surgery on R ankle for ATFL repair, microfx, removal of loose body via arthroscopic surgery.   H/o multiple sprains leading to chronic swelling and pain.  She is currently PWB in a CAM walking boot.  F/u January 18, 2023.  Currently she reports her anlke is feeling better.  She is noticing "tightness" in R lateral  ankle.  She wears the boot and uses b/l crutches when walking PWB.  Not wearing boot with sleeping or driving.    PERTINENT HISTORY: Pt played basketball in high school.   PAIN:  Are you having pain? No  PRECAUTIONS: Other: PWB in CAM walking boot, can progress to WBAT in boot  WEIGHT BEARING RESTRICTIONS: Yes PWB  FALLS:  Has patient fallen in last 6 months? 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    NEXT MD VISIT: 01/18/23  OBJECTIVE:   DIAGNOSTIC FINDINGS: MRI before surgery (see chart review)  PATIENT SURVEYS:  FOTO 37/67  COGNITION: Overall cognitive status: Within functional limits for tasks assessed     SENSATION: WFL  EDEMA:  Figure 8 R and L 49.5  OBSERVATION: pt's lateral incision site is still healing at distal end  POSTURE: pt arrived wearing CAM walking boot and using b/l axillary crutches  PALPATION: Mild typical swelling noted along R  lateral ankle/malleolar region  LOWER EXTREMITY ROM:  Active ROM Right eval Left eval  Ankle dorsiflexion 5 12  Ankle plantarflexion 40 55  Ankle inversion    Ankle eversion     (Blank rows = not tested)  LOWER EXTREMITY MMT:  MMT Right eval Left eval  Ankle dorsiflexion 5 3+  Ankle plantarflexion 5 2   Ankle inversion 5 3+  Ankle eversion 5 3+   (Blank rows = not tested) tested as a "isometric make test" in neutral position of ankle   TODAY'S TREATMENT:                                                                                                                              DATE: 02/07/2023   Subjective:  Pt  is feeling good upon arrival.  She reports no pain.  Had a fairly relaxing weekend.      Therapeutic Exercise: Pt arrives with mild ankle swelling  Scifit Level 6-9 B UE/LE 10 min. Interval workout (2 min on, 30 sec off intensity). Discussed daily activity/ exercise.    Rebounder: Tandem/ SLS/ Airex (NBOS/ tandem/ SLS)/ Reverse BOSU tandem and narrow BOS.  10 throws each with SBA from PT for safety.  3rd size med ball. Not today  Prostretch in //-bars: 5x with static holds 20-30 sec.    BOSU squats x15, with 9 # dumbell goblet squats 2x12  Total gym leg press: bilateral 20x/ single leg 15x (level 22)- not today Total gym double leg hop (gravity reduced) x 10 attempt b/l LE- too difficult   Standing Single leg heel raises R LE x20 with 2 fingers each hand on // bars.  Y-balance: 3x 3 reps standing on RLE  Walking in clinic/ hallway with consistent recip. Gait pattern with focus on heel strike/ toe off and equal wt. Bearing.  Pt. Instructed to increase cadence for several laps in hallway (slight increase in ankle discomfort, not pain).    Supine manual DF/IV/EV stretches: 3x30 sec each; DF MWM x10  DF MWM with R foot on step x10- not today  Supine manual isometrics: PF/DF/IV/EV 5x each (moderate resistance).  Not today  Anterior tib STM x 5 min, palpable trigger points that are tender today.  Ice to R ankle for swelling control.  Updated HEP  PATIENT EDUCATION:  Education details: HEP, PT POC/goals, reviewed WB precautions Person educated: Patient Education method: Explanation, Demonstration, and Handouts Education comprehension: verbalized understanding and needs further education  HOME EXERCISE PROGRAM: Access Code: [email or web address] URL: [email or web address] Date: 02/07/2023 Prepared by: Max Fickle  Exercises - Standing Gastroc Stretch  - 1 x daily - 7 x weekly - 3 reps - 30 hold - Ankle Dorsiflexion with Resistance  - 1 x daily - 7 x weekly - 3 sets - 10  reps - Single Leg Heel Raise with Chair Support  - 1 x daily - 5 x weekly - 20-25 reps - Lower Quarter Anterior Reach  -  1 x daily - 7 x weekly - 3 sets - 3 reps - Squat  - 1 x daily - 3 x weekly - 3 sets - 10 reps  ASSESSMENT:  CLINICAL IMPRESSION: Pt's gait continues to improve. Able to amb with increased speed and nearly symmetrical gait pattern.  Despite pt not reporting pain she does continue to have mild swelling along anterior/lateral ankle.  Ended with cold pack for swelling control and instructed to use cold pack at home between today and next appointment at least 2x/day for swelling management.  She is an appropriate candidate for skilled PT to address the impairments listed below.    OBJECTIVE IMPAIRMENTS: decreased activity tolerance, decreased balance, decreased endurance, decreased mobility, difficulty walking, decreased ROM, decreased strength, and pain.   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   REHAB POTENTIAL: Excellent  CLINICAL DECISION MAKING: Stable/uncomplicated  EVALUATION COMPLEXITY: Low   GOALS: Goals reviewed with patient? Yes  SHORT TERM GOALS: Target date: 01/20/23 (by her follow up with physician) Pt will be able to amb with normal R heel strike/toe off in sneaker WBAT Baseline: initial eval PWB in CAM boot.  3/22: wearing sneaker with more normalized gait pattern/ decrease DF; 4/3: met Goal status: MET  2.  Improve ankle DF R to >10 deg to facilitate normal gait pattern on flat surfaces  Baseline: initial eval 5 degrees; 4/3: 8 degrees Goal status: partially met    LONG TERM GOALS: Target date:  02/23/23  Improve R ankle strength to 4/5 to allow for CKC single leg stance dynamic strengthening in preparation for return to PLOF Baseline: currently PWB at initial eval Goal status: INITIAL  2.  Improve FOTO to >50 indicating improved ability to return to PLOF Baseline: 37 (target expected 67) Goal status: INITIAL  3.  Pt will be able to amb on flat/uneven surfaces without any AD x 20 min without any increase in swelling in preparation for higher level sports specific rehab Baseline: currently PWB Goal status: INITIAL    PLAN:  PT FREQUENCY: 2x/week  PT DURATION: 8 weeks  PLANNED INTERVENTIONS: Therapeutic exercises, Therapeutic activity, Neuromuscular re-education, Balance training, Gait training, Patient/Family education, Self Care, and Joint mobilization  PLAN FOR NEXT SESSION: single leg strength, balance, proprioception; gaining full DF ROM.  CHECK [address] [place], [place], DPT, [place]  #[id number]  02/07/2023, 3:10 PM

## 2023-02-09 ENCOUNTER — Ambulatory Visit: Payer: 59

## 2023-02-09 DIAGNOSIS — M25671 Stiffness of right ankle, not elsewhere classified: Secondary | ICD-10-CM | POA: Diagnosis not present

## 2023-02-09 DIAGNOSIS — R29898 Other symptoms and signs involving the musculoskeletal system: Secondary | ICD-10-CM | POA: Diagnosis not present

## 2023-02-09 DIAGNOSIS — M25371 Other instability, right ankle: Secondary | ICD-10-CM

## 2023-02-09 NOTE — Therapy (Signed)
OUTPATIENT PHYSICAL THERAPY LOWER EXTREMITY TREATMENT  Patient Name: Robin Harris MRN: [id number] DOB:November 03, 2000, 22 y.o., female Today's Date: 02/09/2023  END OF SESSION:  PT End of Session - 02/09/23 1504     Visit Number 11    Number of Visits 16    Date for PT Re-Evaluation 02/23/23    Authorization Type 2x/week for 8 weeks    PT Start Time 1500    PT Stop Time 1545    PT Time Calculation (min) 45 min    Activity Tolerance Patient tolerated treatment well    Behavior During Therapy [facility] for tasks assessed/performed             Past Medical History:  Diagnosis Date   ALL (acute lymphoblastic leukemia) (HCC) 2007   semiannually sees Dr. Doylene Canning [facility]), in remission ==> rec yearly CBC, CMP, CPE, and echo Q64yrs - and plz fax to [facility] peds onc (see note dated 10/2012)   Arthritis    pt states she does not have arthritis   Past Surgical History:  Procedure Laterality Date   MYRINGOTOMY  2005   Bilateral   PORT-A-CATH REMOVAL  2010   PORTACATH PLACEMENT  2007   for chemo   TUMOR REMOVAL  2008   benign; behind left ear   Patient Active Problem List   Diagnosis Date Noted   Overweight with body mass index (BMI) of 29 to 29.9 in adult 09/16/2022   History of acute lymphoblastic leukemia (ALL) 10/27/2021    PCP: Nicki Reaper, NP  REFERRING PROVIDER: Ovid Curd, DPM  REFERRING DIAG: [phone]  THERAPY DIAG:  Right ankle instability  Ankle weakness  Stiffness of right ankle, not elsewhere classified  Rationale for Evaluation and Treatment: Rehabilitation  ONSET DATE: chronic, surgery was 11/17/22  SUBJECTIVE:   SUBJECTIVE STATEMENT: 11/17/22 pt had surgery on R ankle for ATFL repair, microfx, removal of loose body via arthroscopic surgery.   H/o multiple sprains leading to chronic swelling and pain.  She is currently PWB in a CAM walking boot.  F/u January 18, 2023.  Currently she reports her anlke is feeling better.  She is noticing "tightness" in R lateral  ankle.  She wears the boot and uses b/l crutches when walking PWB.  Not wearing boot with sleeping or driving.    PERTINENT HISTORY: Pt played basketball in high school.   PAIN:  Are you having pain? No  PRECAUTIONS: Other: PWB in CAM walking boot, can progress to WBAT in boot  WEIGHT BEARING RESTRICTIONS: Yes PWB  FALLS:  Has patient fallen in last 6 months? 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    NEXT MD VISIT: 01/18/23  OBJECTIVE:   DIAGNOSTIC FINDINGS: MRI before surgery (see chart review)  PATIENT SURVEYS:  FOTO 37/67  COGNITION: Overall cognitive status: Within functional limits for tasks assessed     SENSATION: WFL  EDEMA:  Figure 8 R and L 49.5  OBSERVATION: pt's lateral incision site is still healing at distal end  POSTURE: pt arrived wearing CAM walking boot and using b/l axillary crutches  PALPATION: Mild typical swelling noted along R  lateral ankle/malleolar region  LOWER EXTREMITY ROM:  Active ROM Right eval Left eval  Ankle dorsiflexion 5 12  Ankle plantarflexion 40 55  Ankle inversion    Ankle eversion     (Blank rows = not tested)  LOWER EXTREMITY MMT:  MMT Right eval Left eval  Ankle dorsiflexion 5 3+  Ankle plantarflexion 5 2   Ankle inversion 5 3+  Ankle eversion 5 3+   (Blank rows = not tested) tested as a "isometric make test" in neutral position of ankle   TODAY'S TREATMENT:                                                                                                                              DATE: 02/09/2023   Subjective:  Pt  led a walking group at work today- walked 1 mile with each group.  And had to do some other walking too; she clocked >10,000 steps prior to coming to PT.  She iced at home yesterday.     Therapeutic Exercise: Pt arrives with mild ankle swelling  Scifit Level 6-9 B UE/LE 10 min. Interval workout (2 min on, 30 sec off intensity). Discussed daily activity/ exercise.    Rebounder: Tandem/ SLS/ Airex (NBOS/ tandem/ SLS)/ Reverse BOSU tandem and narrow BOS.  10 throws each with SBA from PT for safety.  3rd size med ball.   Prostretch in //-bars: 5x with static holds 20-30 sec.    BOSU squats x15, with 9 # dumbell goblet squats 2x12  Total gym leg press: bilateral 20x/ single leg 15x (level 22)- not today Total gym double leg hop (gravity reduced) x 10 attempt b/l LE- not today  Standing Single leg heel raises R LE x20 with 2 fingers each hand on // bars.  STAR Excursion/Y-balance: 3x 3 reps standing on RLE (STAR today) without shoes today  Single leg stance with RDL to tap cone/pick up/set down cone 3 in front 5x ea  Lunges with sliders: Single leg stance on R with L foot slide posterior and lateral: to fatigue 1 set each  Walking in clinic/ hallway with consistent recip. Gait pattern with focus on heel strike/ toe off and equal wt. Bearing.  Pt. Instructed to increase cadence for several laps in hallway (slight increase in ankle discomfort, not pain).    STM retrograde massage R ankle with leg elevated to address swelling today  Ice to R ankle for swelling control at end of session x 10 min  Pt ed for continuing to ice for swelling and to wear taller sock  with mild compression for edema control this week  Updated HEP  PATIENT EDUCATION:  Education details: HEP, PT POC/goals, reviewed WB precautions Person educated: Patient Education method: Explanation, Demonstration, and Handouts Education comprehension: verbalized understanding and needs further education  HOME EXERCISE  PROGRAM: Access Code: [email or web address] URL: [email or web address] Date: 02/07/2023 Prepared by: Max Fickle  Exercises - Standing Gastroc Stretch  - 1 x daily -  7 x weekly - 3 reps - 30 hold - Ankle Dorsiflexion with Resistance  - 1 x daily - 7 x weekly - 3 sets - 10 reps - Single Leg Heel Raise with Chair Support  - 1 x daily - 5 x weekly - 20-25 reps - Lower Quarter Anterior Reach  - 1 x daily - 7 x weekly - 3 sets - 3 reps - Squat  - 1 x daily - 3 x weekly - 3 sets - 10 reps  ASSESSMENT:  CLINICAL IMPRESSION: Pt is tolerating single leg strength and proprioception exercises well. Progression to next phase of rehab is limited by swelling at this point.   PT performed manual therapy to address and discussed strategies to manage swelling at home between today and next session.  Ended with cold pack for swelling control and instructed to use cold pack at home between today and next appointment at least 2x/day for swelling management.  She is an appropriate candidate for skilled PT to address the impairments listed below.    OBJECTIVE IMPAIRMENTS: decreased activity tolerance, decreased balance, decreased endurance, decreased mobility, difficulty walking, decreased ROM, decreased strength, and pain.   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   REHAB POTENTIAL: Excellent  CLINICAL DECISION MAKING: Stable/uncomplicated  EVALUATION COMPLEXITY: Low   GOALS: Goals reviewed with patient? Yes  SHORT TERM GOALS: Target date: 01/20/23 (by her follow up with physician) Pt will be able to amb with normal R heel strike/toe off in  sneaker WBAT Baseline: initial eval PWB in CAM boot.  3/22: wearing sneaker with more normalized gait pattern/ decrease DF; 4/3: met Goal status: MET  2.  Improve ankle DF R to >10 deg to facilitate normal gait pattern on flat surfaces  Baseline: initial eval 5 degrees; 4/3: 8 degrees Goal status: partially met    LONG TERM GOALS: Target date: 02/23/23  Improve R ankle strength to 4/5 to allow for CKC single leg stance dynamic strengthening in preparation for return to PLOF Baseline: currently PWB at initial eval Goal status: INITIAL  2.  Improve FOTO to >50 indicating improved ability to return to PLOF Baseline: 37 (target expected 67) Goal status: INITIAL  3.  Pt will be able to amb on flat/uneven surfaces without any AD x 20 min without any increase in swelling in preparation for higher level sports specific rehab Baseline: currently PWB Goal status: INITIAL    PLAN:  PT FREQUENCY: 2x/week  PT DURATION: 8 weeks  PLANNED INTERVENTIONS: Therapeutic exercises, Therapeutic activity, Neuromuscular re-education, Balance training, Gait training, Patient/Family education, Self Care, and Joint mobilization  PLAN FOR NEXT SESSION: single leg strength, balance, proprioception; gaining full DF ROM, edema control, [address]CHECK FOTO  Mckenize Mezera, PT, DPT, [place]  #[phone]  02/09/2023, 3:05 PM

## 2023-02-10 ENCOUNTER — Encounter: Payer: 59 | Admitting: Physical Therapy

## 2023-02-16 ENCOUNTER — Ambulatory Visit: Payer: 59

## 2023-02-16 DIAGNOSIS — M25671 Stiffness of right ankle, not elsewhere classified: Secondary | ICD-10-CM | POA: Diagnosis not present

## 2023-02-16 DIAGNOSIS — R29898 Other symptoms and signs involving the musculoskeletal system: Secondary | ICD-10-CM

## 2023-02-16 DIAGNOSIS — M25371 Other instability, right ankle: Secondary | ICD-10-CM

## 2023-02-16 NOTE — Therapy (Signed)
OUTPATIENT PHYSICAL THERAPY LOWER EXTREMITY TREATMENT  Patient Name: Robin Harris MRN: [id number] DOB:2001/09/22, 22 y.o., female Today's Date: 02/16/2023  END OF SESSION:  PT End of Session - 02/16/23 1514     Visit Number 12    Number of Visits 16    Date for PT Re-Evaluation 02/23/23    Authorization Type 2x/week for 8 weeks    PT Start Time 1500    PT Stop Time 1545    PT Time Calculation (min) 45 min    Activity Tolerance Patient tolerated treatment well    Behavior During Therapy [facility] for tasks assessed/performed             Past Medical History:  Diagnosis Date   ALL (acute lymphoblastic leukemia) (HCC) 2007   semiannually sees Dr. Doylene Canning [facility] in remission ==> rec yearly CBC, CMP, CPE, and echo Q33yrs - and plz fax to [facility] peds onc (see note dated 10/2012)   Arthritis    pt states she does not have arthritis   Past Surgical History:  Procedure Laterality Date   MYRINGOTOMY  2005   Bilateral   PORT-A-CATH REMOVAL  2010   PORTACATH PLACEMENT  2007   for chemo   TUMOR REMOVAL  2008   benign; behind left ear   Patient Active Problem List   Diagnosis Date Noted   Overweight with body mass index (BMI) of 29 to 29.9 in adult 09/16/2022   History of acute lymphoblastic leukemia (ALL) 10/27/2021    PCP: Nicki Reaper, NP  REFERRING PROVIDER: Ovid Curd, DPM  REFERRING DIAG: [phone]  THERAPY DIAG:  Right ankle instability  Ankle weakness  Stiffness of right ankle, not elsewhere classified  Rationale for Evaluation and Treatment: Rehabilitation  ONSET DATE: chronic, surgery was 11/17/22  SUBJECTIVE:   SUBJECTIVE STATEMENT: 11/17/22 pt had surgery on R ankle for ATFL repair, microfx, removal of loose body via arthroscopic surgery.   H/o multiple sprains leading to chronic swelling and pain.  She is currently PWB in a CAM walking boot.  F/u January 18, 2023.  Currently she reports her anlke is feeling better.  She is noticing "tightness" in R lateral  ankle.  She wears the boot and uses b/l crutches when walking PWB.  Not wearing boot with sleeping or driving.    PERTINENT HISTORY: Pt played basketball in high school.   PAIN:  Are you having pain? No  PRECAUTIONS: Other: PWB in CAM walking boot, can progress to WBAT in boot  WEIGHT BEARING RESTRICTIONS: Yes PWB  FALLS:  Has patient fallen in last 6 months? 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    NEXT MD VISIT: 01/18/23  OBJECTIVE:   DIAGNOSTIC FINDINGS: MRI before surgery (see chart review)  PATIENT SURVEYS:  FOTO 37/67  COGNITION: Overall cognitive status: Within functional limits for tasks assessed     SENSATION: WFL  EDEMA:  Figure 8 R and L 49.5  OBSERVATION: pt's lateral incision site is still healing at distal end  POSTURE: pt arrived wearing CAM walking boot and using b/l axillary crutches  PALPATION: Mild typical swelling noted along R  lateral ankle/malleolar region  LOWER EXTREMITY ROM:  Active ROM Right eval Left eval  Ankle dorsiflexion 5 12  Ankle plantarflexion 40 55  Ankle inversion    Ankle eversion     (Blank rows = not tested)  LOWER EXTREMITY MMT:  MMT Right eval Left eval  Ankle dorsiflexion 5 3+  Ankle plantarflexion 5 2   Ankle inversion 5 3+  Ankle eversion 5 3+   (Blank rows = not tested) tested as a "isometric make test" in neutral position of ankle   TODAY'S TREATMENT:                                                                                                                              DATE: 02/16/2023   Subjective: Pt  reports her ankle has felt swollen this week; she does report 1 episode of turning her ankle on curb when trying to get in the truck.  She reports it was more scary than actually painful at that time.  She currently notes 3-4/10 pain level.  And reports feeling intermittent "sharp" pain with weight bearing on R or if she turns her ankle in.    Therapeutic Exercise: Pt arrives with mild ankle swelling; figure 8 measurement difference R is 1 cm> L today.  TTP along surgical region.  Reduced intensity of PT program today to account for pain/swelling.   Scifit Level 6 B UE/LE 10 min. Discussed daily activity/ exercise.    Rebounder: Tandem/ SLS/ Airex (NBOS/ tandem/ SLS)/ Reverse BOSU tandem and narrow BOS.  10 throws each with SBA from PT for safety.  3rd size med ball. Not today  Prostretch in //-bars: 5x with static holds 20-30 sec.  Not today  BOSU squats x15, with 9 # dumbell goblet squats 2x12 Not today  Total gym leg press: bilateral 20x/ single leg 15x (level 22)- not today Total gym double leg hop (gravity reduced) x 10 attempt b/l LE- not today  Standing bilateral  heel raises 2x10; single leg heel raises R LE x10 with 2 fingers each hand on // bars.  Single leg static balance: 3x 30 seconds without shoes on today  STAR Excursion/Y-balance: 3x 3 reps standing on RLE (STAR today) without shoes today  Single leg stance with RDL to tap cone/pick up/set down cone 3 in front 5x ea- not today  Single leg balance on airex: 3x20 sec (increased sway)  Lunges with sliders: Single leg stance on R with L foot slide posterior and lateral: to fatigue 1 set each not today  Isometric MRE all 4 directions of ankle: 5 second hold x 10 ea  STM retrograde massage R ankle with leg elevated to address swelling today  Ice to R ankle for swelling control at end of session x 10 min  Pt ed for continuing to ice for swelling and to wear taller sock/compression  with mild compression for edema control  this week    PATIENT EDUCATION:  Education details: HEP, PT POC/goals, reviewed WB precautions Person educated: Patient Education method: Explanation, Demonstration, and Handouts Education comprehension: verbalized understanding and needs further education  HOME EXERCISE PROGRAM: Access Code: [email or web address] URL: [email or web address] Date: 02/07/2023 Prepared by: Max Fickle  Exercises - Standing Gastroc Stretch  - 1 x daily - 7 x weekly - 3 reps - 30 hold - Ankle Dorsiflexion with Resistance  - 1 x daily - 7 x weekly - 3 sets - 10 reps - Single Leg Heel Raise with Chair Support  - 1 x daily - 5 x weekly - 20-25 reps - Lower Quarter Anterior Reach  - 1 x daily - 7 x weekly - 3 sets - 3 reps - Squat  - 1 x daily - 3 x weekly - 3 sets - 10 reps  ASSESSMENT:  CLINICAL IMPRESSION: Pt able to perform SL CKC proprioception exercises today with good/fair control.  She does not pain after the sets when actively moving her ankle in nonweight bearing position. PT performed manual therapy to address and discussed strategies to manage swelling at home between today and next session.  Ended with cold pack for swelling control and instructed to use cold pack at home between today and next appointment at least 2x/day for swelling management.  Discussed concern with her about increased pain and swelling this week.  If sx no better at next visit will recommend she f/u with referring provider.  She is an appropriate candidate for skilled PT to address the impairments listed below.    OBJECTIVE IMPAIRMENTS: decreased activity tolerance, decreased balance, decreased endurance, decreased mobility, difficulty walking, decreased ROM, decreased strength, and pain.   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   REHAB POTENTIAL: Excellent  CLINICAL DECISION MAKING: Stable/uncomplicated  EVALUATION COMPLEXITY: Low   GOALS: Goals reviewed with patient? Yes  SHORT TERM GOALS: Target date: 01/20/23 (by her follow up with physician) Pt will be able to amb with normal R heel strike/toe off in sneaker WBAT Baseline: initial eval PWB in CAM boot.  3/22: wearing sneaker with more normalized gait pattern/ decrease DF; 4/3: met Goal status: MET  2.  Improve ankle DF R to >10 deg to facilitate normal gait pattern on flat surfaces  Baseline: initial eval 5 degrees; 4/3: 8 degrees Goal status: partially met    LONG TERM GOALS: Target date: 02/23/23  Improve R ankle strength to 4/5 to allow for CKC single leg stance dynamic strengthening in preparation for return to PLOF Baseline: currently PWB at initial eval Goal status: INITIAL  2.  Improve FOTO to >50 indicating improved ability to return to PLOF Baseline: 37 (target expected 67) Goal status: INITIAL  3.  Pt will be able to amb on flat/uneven surfaces without any AD x 20 min without any increase in swelling in preparation for higher level sports specific rehab Baseline: currently PWB Goal status: INITIAL    PLAN:  PT FREQUENCY: 2x/week  PT DURATION: 8 weeks  PLANNED INTERVENTIONS: Therapeutic exercises, Therapeutic activity, Neuromuscular re-education, Balance training, Gait training, Patient/Family education, Self Care, and Joint mobilization  PLAN FOR NEXT SESSION: single leg strength, balance, proprioception; edema control, CHECK FOTO and progress note at next session  Max Fickle, PT, DPT, OCS  [phone]  02/16/2023, 3:15 PM

## 2023-02-23 ENCOUNTER — Ambulatory Visit: Payer: 59

## 2023-02-23 DIAGNOSIS — M25671 Stiffness of right ankle, not elsewhere classified: Secondary | ICD-10-CM | POA: Diagnosis not present

## 2023-02-23 DIAGNOSIS — R29898 Other symptoms and signs involving the musculoskeletal system: Secondary | ICD-10-CM | POA: Diagnosis not present

## 2023-02-23 DIAGNOSIS — M25371 Other instability, right ankle: Secondary | ICD-10-CM

## 2023-02-23 NOTE — Therapy (Deleted)
OUTPATIENT PHYSICAL THERAPY LOWER EXTREMITY TREATMENT  Patient Name: Robin Harris MRN: [id number] DOB:[birth year], 22 y.o., female Today's Date: 02/23/2023  END OF SESSION:    Past Medical History:  Diagnosis Date   ALL (acute lymphoblastic leukemia) (HCC) 2007   semiannually sees Dr. Doylene Canning [facility]), in remission ==> rec yearly CBC, CMP, CPE, and echo Q54yrs - and plz fax to [facility] peds onc (see note dated 10/2012)   Arthritis    pt states she does not have arthritis   Past Surgical History:  Procedure Laterality Date   MYRINGOTOMY  2005   Bilateral   PORT-A-CATH REMOVAL  2010   PORTACATH PLACEMENT  2007   for chemo   TUMOR REMOVAL  2008   benign; behind left ear   Patient Active Problem List   Diagnosis Date Noted   Overweight with body mass index (BMI) of 29 to 29.9 in adult 09/16/2022   History of acute lymphoblastic leukemia (ALL) 10/27/2021    PCP: Nicki Reaper, NP  REFERRING PROVIDER: Ovid Curd, DPM  REFERRING DIAG: [phone]  THERAPY DIAG:  No diagnosis found.  Rationale for Evaluation and Treatment: Rehabilitation  ONSET DATE: chronic, surgery was 11/17/22  SUBJECTIVE:   SUBJECTIVE STATEMENT: 11/17/22 pt had surgery on R ankle for ATFL repair, microfx, removal of loose body via arthroscopic surgery.   H/o multiple sprains leading to chronic swelling and pain.  She is currently PWB in a CAM walking boot.  F/u January 18, 2023.  Currently she reports her anlke is feeling better.  She is noticing "tightness" in R lateral ankle.  She wears the boot and uses b/l crutches when walking PWB.  Not wearing boot with sleeping or driving.    PERTINENT HISTORY: Pt played basketball in high school.   PAIN:  Are you having pain? No  PRECAUTIONS: Other: PWB in CAM walking boot, can progress to WBAT in boot  WEIGHT BEARING RESTRICTIONS: Yes PWB  FALLS:  Has patient fallen in last 6 months? 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    NEXT MD VISIT: 01/18/23  OBJECTIVE:   DIAGNOSTIC FINDINGS: MRI before surgery (see chart review)  PATIENT SURVEYS:  FOTO 37/67  COGNITION: Overall cognitive status: Within functional limits for tasks assessed     SENSATION: WFL  EDEMA:  Figure 8 R and L 49.5  OBSERVATION: pt's lateral incision site is still healing at distal end  POSTURE: pt arrived wearing CAM walking boot and using b/l axillary crutches  PALPATION: Mild typical swelling noted along R lateral ankle/malleolar region  LOWER EXTREMITY ROM:  Active ROM Right eval Left eval  Ankle dorsiflexion 5 12  Ankle plantarflexion 40 55  Ankle inversion    Ankle eversion     (Blank rows = not tested)  LOWER EXTREMITY MMT:  MMT Right eval Left eval  Ankle dorsiflexion 5 3+  Ankle plantarflexion 5 2   Ankle inversion 5 3+  Ankle eversion 5 3+   (Blank rows = not tested) tested as a "isometric make test" in neutral position of ankle   TODAY'S TREATMENT:  DATE: 02/23/2023   Subjective: Pt reports she has been taking it easy since last session and she feels like the swelling is better in her ankle.  She is walking ~1 mile a day.  She did a leg workout early this morning- mostly legs and it felt good.  She has been mostly pain free in the early day and then gets sore in her ankle at end of the day.  She does feel better overall than last week.    Therapeutic Exercise: Pt arrives with mild ankle swelling; figure 8 measurement  difference R is 1 cm> L today.  TTP along surgical region.  Reduced intensity of PT program today to account for pain/swelling.   Scifit Level 6 B UE/LE 5 min. Discussed daily activity/ exercise.    Rebounder: Tandem/ SLS/ Airex (NBOS/ tandem/ SLS)/ Reverse BOSU tandem and narrow BOS.  10 throws each with SBA from PT for safety.  3rd size med ball. Not today  Prostretch in //-bars: 5x with static holds 20-30 sec.  Not today  BOSU squats x15, with 9 # dumbell goblet squats 2x12 Not today  Total gym leg press: bilateral 20x/ single leg 15x (level 22)- not today Total gym double leg hop (gravity reduced) x 10 attempt b/l LE- not today    Standing bilateral  heel raises 2x10; single leg heel raises R LE x10 with 2 fingers each hand on // bars.  Single leg static balance: 3x 30 seconds without shoes on today  STAR Excursion/Y-balance: 3x 3 reps standing on RLE (STAR today) without shoes today  Single leg stance with RDL to tap cone/pick up/set down cone 3 in front 5x ea- not today  Single leg balance on airex: 3x20 sec (increased sway)  Lunges with sliders: Single leg stance on R with L foot slide posterior and lateral: to fatigue 1 set each not today  Isometric MRE all 4 directions of ankle: 5 second hold x 10 ea  STM retrograde massage R ankle with leg elevated to address swelling today  Ice to R ankle for swelling control at end of session x 10 min  Pt ed for continuing to ice for swelling and to wear taller sock/compression  with mild compression for edema control this week    PATIENT EDUCATION:  Education details: HEP, PT POC/goals, reviewed WB precautions Person educated: Patient Education method: Explanation, Demonstration, and Handouts Education comprehension: verbalized understanding and needs further education  HOME EXERCISE PROGRAM: Access Code: [email or web address] URL: [email or web address] Date: 02/07/2023 Prepared by: Max Fickle  Exercises - Standing  Gastroc Stretch  - 1 x daily - 7 x weekly - 3 reps - 30 hold - Ankle Dorsiflexion with Resistance  - 1 x daily - 7 x weekly - 3 sets - 10 reps - Single Leg Heel Raise with Chair Support  - 1 x daily - 5 x weekly - 20-25 reps - Lower Quarter Anterior Reach  - 1 x daily - 7 x weekly - 3 sets - 3 reps - Squat  - 1 x daily - 3 x weekly - 3 sets - 10 reps  ASSESSMENT:  CLINICAL IMPRESSION: Pt able to perform SL CKC proprioception exercises today with good/fair control.  She does not pain after the sets when actively moving her ankle in nonweight bearing position. PT performed manual therapy to address and discussed strategies to manage swelling at home between today and next session.  Ended with cold pack for swelling control and instructed to use cold pack at  home between today and next appointment at least 2x/day for swelling management.  Discussed concern with her about increased pain and swelling this week.  If sx no better at next visit will recommend she f/u with referring provider.  She is an appropriate candidate for skilled PT to address the impairments listed below.    OBJECTIVE IMPAIRMENTS: decreased activity tolerance, decreased balance, decreased endurance, decreased mobility, difficulty walking, decreased ROM, decreased strength, and pain.   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   REHAB POTENTIAL: Excellent  CLINICAL DECISION MAKING: Stable/uncomplicated  EVALUATION COMPLEXITY: Low   GOALS: Goals reviewed with patient? Yes  SHORT TERM GOALS: Target date: 01/20/23 (by her follow up with physician) Pt will be able to amb with  normal R heel strike/toe off in sneaker WBAT Baseline: initial eval PWB in CAM boot.  3/22: wearing sneaker with more normalized gait pattern/ decrease DF; 4/3: met Goal status: MET  2.  Improve ankle DF R to >10 deg to facilitate normal gait pattern on flat surfaces  Baseline: initial eval 5 degrees; 4/3: 8 degrees Goal status: partially met    LONG TERM GOALS: Target date: 02/23/23  Improve R ankle strength to 4/5 to allow for CKC single leg stance dynamic strengthening in preparation for return to PLOF Baseline: currently PWB at initial eval Goal status: INITIAL  2.  Improve FOTO to >50 indicating improved ability to return to PLOF Baseline: 37 (target expected 67) Goal status: INITIAL  3.  Pt will be able to amb on flat/uneven surfaces without any AD x 20 min without any increase in swelling in preparation for higher level sports specific rehab Baseline: currently PWB Goal status: INITIAL    PLAN:  PT FREQUENCY: 2x/week  PT DURATION: 8 weeks  PLANNED INTERVENTIONS: Therapeutic exercises, Therapeutic activity, Neuromuscular re-education, Balance training, Gait training, Patient/Family education, Self Care, and Joint mobilization  PLAN FOR NEXT SESSION: single leg strength, balance, proprioception; edema control, CHECK FOTO and progress note at next session  Max Fickle, PT, DPT, OCS  [phone]  02/23/2023, 3:03 PM

## 2023-02-23 NOTE — Therapy (Addendum)
OUTPATIENT PHYSICAL THERAPY LOWER EXTREMITY TREATMENT/RE-CERT/PROGRESS NOTE  Patient Name: Robin Harris MRN: [id number] DOB:10-Mar-2001, 22 y.o., female Today's Date: 02/23/2023  END OF SESSION:  PT End of Session - 02/23/23 1754     Visit Number 13    Number of Visits 28    Date for PT Re-Evaluation 04/06/23    Authorization Type re-cert on 1/91 for 2x/week for 6 weeks    Authorization Time Period PN done on 02/23/23    PT Start Time 1500    PT Stop Time 1555    PT Time Calculation (min) 55 min    Activity Tolerance Patient tolerated treatment well    Behavior During Therapy [facility] for tasks assessed/performed             Past Medical History:  Diagnosis Date   ALL (acute lymphoblastic leukemia) (HCC) 2007   semiannually sees Dr. Doylene Canning [facility]), in remission ==> rec yearly CBC, CMP, CPE, and echo Q35yrs - and plz fax to [facility] peds onc (see note dated 10/2012)   Arthritis    pt states she does not have arthritis   Past Surgical History:  Procedure Laterality Date   MYRINGOTOMY  2005   Bilateral   PORT-A-CATH REMOVAL  2010   PORTACATH PLACEMENT  2007   for chemo   TUMOR REMOVAL  2008   benign; behind left ear   Patient Active Problem List   Diagnosis Date Noted   Overweight with body mass index (BMI) of 29 to 29.9 in adult 09/16/2022   History of acute lymphoblastic leukemia (ALL) 10/27/2021    PCP: Nicki Reaper, NP  REFERRING PROVIDER: Ovid Curd, DPM  REFERRING DIAG: [phone]  THERAPY DIAG:  Right ankle instability - Plan: PT plan of care cert/re-cert  Ankle weakness - Plan: PT plan of care cert/re-cert  Stiffness of right ankle, not elsewhere classified - Plan: PT plan of care cert/re-cert  Rationale for Evaluation and Treatment: Rehabilitation  ONSET DATE: chronic, surgery was 11/17/22  SUBJECTIVE:   SUBJECTIVE STATEMENT: 11/17/22 pt had surgery on R ankle for ATFL repair, microfx, removal of loose body via arthroscopic surgery.   H/o multiple sprains  leading to chronic swelling and pain.  She is currently PWB in a CAM walking boot.  F/u January 18, 2023.  Currently she reports her anlke is feeling better.  She is noticing "tightness" in R lateral ankle.  She wears the boot and uses b/l crutches when walking PWB.  Not wearing boot with sleeping or driving.    PERTINENT HISTORY: Pt played basketball in high school.   PAIN:  Are you having pain? No  PRECAUTIONS: Other: PWB in CAM walking boot, can progress to WBAT in boot  WEIGHT BEARING RESTRICTIONS: Yes PWB  FALLS:  Has patient fallen in last 6 months? 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    NEXT MD VISIT: 01/18/23  OBJECTIVE:   DIAGNOSTIC FINDINGS: MRI before surgery (see chart review)  PATIENT SURVEYS:  FOTO 37/67  COGNITION: Overall cognitive status: Within functional limits for tasks assessed     SENSATION: WFL  EDEMA:  Figure 8 R  and L 49.5  OBSERVATION: pt's lateral incision site is still healing at distal end  POSTURE: pt arrived wearing CAM walking boot and using b/l axillary crutches  PALPATION: Mild typical swelling noted along R lateral ankle/malleolar region  LOWER EXTREMITY ROM:  Active ROM Right eval Left eval  Ankle dorsiflexion 5 12  Ankle plantarflexion 40 55  Ankle inversion    Ankle eversion     (Blank rows = not tested)  LOWER EXTREMITY MMT:  MMT Right eval Left eval  Ankle dorsiflexion 5 3+  Ankle plantarflexion 5 2   Ankle inversion 5 3+  Ankle eversion 5 3+   (Blank rows = not tested) tested as a "isometric make test" in neutral  position of ankle   TODAY'S TREATMENT:                                                                                                                              DATE: 02/23/2023   Subjective: Pt reports overall feeling much better this week compared to last week.  She took it easy more this week, it was a less active week. She only walked ~ 1 mile a day and did not work as much.  Wore compression sock some too.  She notes her ankle is not sure when she wakes up, some soreness later in the day.  No episodes of inversion noted this week.  She did go to the gym this morning and did a leg strength workout for the first time, she is tired but no ankle pain noted.  Pain currently: 0-1/10.  Therapeutic Exercise: R ankle DF: 10 deg, PF 45 deg R ankle strength: DF 4/5, PF 4/5 (+) mild swelling along R surgical site   Scifit Level 6 B UE/LE 15 min. Discussed daily activity/ exercise.    Prostretch in //-bars: 5x with static holds 20-30 sec.    Standing bilateral  heel raises 2x10; single leg heel raises R LE x10 with 2 fingers each hand on // bars.  Tandem stance on 1/2 FR: 1 min ea direction, then added PT perturbation (external stimulus) x 1 min ea.  STAR Excursion/Y-balance: 3x 3 reps standing on RLE (STAR today) without shoes today  Single leg stance with RDL to tap cone/pick up/set down cone to fatigue 2 rounds  Low intensity agility: ladder: in/in, out/out moving laterally to L and R x2 ea Double leg jumps: x10 Double leg line jumps A/P: 15 sec intervals x4, lateral: 15 sec intervals x4   Manual Therapy: STM retrograde massage R ankle, anterior tib, plantar fascia Gentle DF MWM x10  Ice to R ankle for swelling/pain control at end of session x 10 min  Pt ed for continuing to ice for swelling and to wear taller sock/compression  with mild compression for edema control this week    Not today: Single leg balance on airex: 3x20 sec (increased sway) Lunges with sliders: Single leg  stance on R with  L foot slide posterior and lateral: to fatigue 1 set each not today Isometric MRE all 4 directions of ankle: 5 second hold x 10 ea Rebounder: Tandem/ SLS/ Airex (NBOS/ tandem/ SLS)/ Reverse BOSU tandem and narrow BOS.  10 throws each with SBA from PT for safety.  3rd size med ball. BOSU squats x15, with 9 # dumbell goblet squats 2x12 Total gym leg press: bilateral 20x/ single leg 15x (level 22 Total gym double leg hop (gravity reduced) x 10 attempt b/l LE  PATIENT EDUCATION:  Education details: HEP, PT POC/goals, reviewed WB precautions Person educated: Patient Education method: Explanation, Demonstration, and Handouts Education comprehension: verbalized understanding and needs further education  HOME EXERCISE PROGRAM: Access Code: [email or web address] URL: [email or web address] Date: 02/07/2023 Prepared by: Max Fickle  Exercises - Standing Gastroc Stretch  - 1 x daily - 7 x weekly - 3 reps - 30 hold - Ankle Dorsiflexion with Resistance  - 1 x daily - 7 x weekly - 3 sets - 10 reps - Single Leg Heel Raise with Chair Support  - 1 x daily - 5 x weekly - 20-25 reps - Lower Quarter Anterior Reach  - 1 x daily - 7 x weekly - 3 sets - 3 reps - Squat  - 1 x daily - 3 x weekly - 3 sets - 10 reps  ASSESSMENT:  CLINICAL IMPRESSION: Pt is making progress with PT.  This week her pain and ankle swelling are better than last week.  Last week she had a small set back with her overall recovery after increasing her walking volume at work significantly and also having an episode of inversion off curb as she caught herself while getting into a trunk.  Her ankle ROM, strength, LE proprioception are improving.  Her swelling and pain are better today.  She has started low level agility exercises.  Would like to see swelling decrease more though before beginning higher level return to sport activities.  She is an appropriate candidate for skilled PT to address the impairments listed  below.    OBJECTIVE IMPAIRMENTS: decreased activity tolerance, decreased balance, decreased endurance, decreased mobility, difficulty walking, decreased ROM, decreased strength, and pain.   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   REHAB POTENTIAL: Excellent  CLINICAL DECISION MAKING: Stable/uncomplicated  EVALUATION COMPLEXITY: Low   GOALS: Goals reviewed with patient? Yes  SHORT TERM GOALS: Target date: 01/20/23 (by her follow up with physician) Pt will be able to amb with normal R heel strike/toe off in sneaker WBAT Baseline: initial eval PWB in CAM boot.  3/22: wearing sneaker with more normalized gait pattern/ decrease DF; 4/3: met Goal status: MET  2.  Improve ankle DF R to >10 deg to facilitate normal gait pattern on flat surfaces  Baseline: initial eval 5 degrees; 4/3: 8 degrees; 02/23/23: 10 deg Goal status: partially met    LONG TERM GOALS: Target date: 04/06/23  Improve R ankle strength to 4/5 to allow for CKC single leg stance dynamic strengthening in preparation for return to PLOF Baseline: currently PWB at initial eval; 4/24: 4/5 DF and PF,  Goal status: MET  2.  Improve FOTO to >50 indicating improved ability to return to PLOF Baseline: 37 (target expected 67) Goal status: INITIAL  3.  Pt will  be able to amb on flat/uneven surfaces without any AD x 20 min without any increase in swelling in preparation for higher level sports specific rehab Baseline: currently PWB; 4/24: mild swelling still present Goal status: INITIAL    PLAN:  PT FREQUENCY: 2x/week  PT DURATION: 8 weeks  PLANNED INTERVENTIONS:  Therapeutic exercises, Therapeutic activity, Neuromuscular re-education, Balance training, Gait training, Patient/Family education, Self Care, and Joint mobilization  PLAN FOR NEXT SESSION: FOTO at next session; continue with progressing proprioception, strength, agility, power to return to running, jumping, basketball recreationally  Mullens, PT, DPT, OCS  [phone]  02/23/2023, 6:23 PM

## 2023-03-02 ENCOUNTER — Ambulatory Visit: Payer: 59

## 2023-03-07 ENCOUNTER — Other Ambulatory Visit (HOSPITAL_COMMUNITY): Payer: Self-pay

## 2023-03-09 ENCOUNTER — Ambulatory Visit: Payer: 59 | Attending: Internal Medicine

## 2023-03-09 DIAGNOSIS — M25671 Stiffness of right ankle, not elsewhere classified: Secondary | ICD-10-CM | POA: Insufficient documentation

## 2023-03-09 DIAGNOSIS — M25371 Other instability, right ankle: Secondary | ICD-10-CM

## 2023-03-09 DIAGNOSIS — R29898 Other symptoms and signs involving the musculoskeletal system: Secondary | ICD-10-CM

## 2023-03-09 NOTE — Therapy (Signed)
OUTPATIENT PHYSICAL THERAPY LOWER EXTREMITY TREATMENT Patient Name: Robin Harris MRN: [id number] DOB:2001/01/30, 22 y.o., female Today's Date: 03/09/2023  END OF SESSION:  PT End of Session - 03/09/23 1459     Visit Number 14    Number of Visits 28    Date for PT Re-Evaluation 04/06/23    Authorization Type re-cert on 1/47 for 2x/week for 6 weeks    Authorization Time Period PN done on 02/23/23    PT Start Time 1500    PT Stop Time 1600    PT Time Calculation (min) 60 min    Activity Tolerance Patient tolerated treatment well    Behavior During Therapy [facility] for tasks assessed/performed             Past Medical History:  Diagnosis Date   ALL (acute lymphoblastic leukemia) (HCC) 2007   semiannually sees Dr. Doylene [facility]), in remission ==> rec yearly CBC, CMP, CPE, and echo Q79yrs - and plz fax to [facility] peds onc (see note dated 10/2012)   Arthritis    pt states she does not have arthritis   Past Surgical History:  Procedure Laterality Date   MYRINGOTOMY  2005   Bilateral   PORT-A-CATH REMOVAL  2010   PORTACATH PLACEMENT  2007   for chemo   TUMOR REMOVAL  2008   benign; behind left ear   Patient Active Problem List   Diagnosis Date Noted   Overweight with body mass index (BMI) of 29 to 29.9 in adult 09/16/2022   History of acute lymphoblastic leukemia (ALL) 10/27/2021    PCP: Nicki Reaper, NP  REFERRING PROVIDER: Ovid Curd, DPM  REFERRING DIAG: [phone]  THERAPY DIAG:  Right ankle instability  Ankle weakness  Stiffness of right ankle, not elsewhere classified  Rationale for Evaluation and Treatment: Rehabilitation  ONSET DATE: chronic, surgery was 11/17/22  SUBJECTIVE:   SUBJECTIVE STATEMENT: 11/17/22 pt had surgery on R ankle for ATFL repair, microfx, removal of loose body via arthroscopic surgery.   H/o multiple sprains leading to chronic swelling and pain.  She is currently PWB in a CAM walking boot.  F/u January 18, 2023.  Currently she reports her  anlke is feeling better.  She is noticing "tightness" in R lateral ankle.  She wears the boot and uses b/l crutches when walking PWB.  Not wearing boot with sleeping or driving.    PERTINENT HISTORY: Pt played basketball in high school.   PAIN:  Are you having pain? No  PRECAUTIONS: Other: PWB in CAM walking boot, can progress to WBAT in boot  WEIGHT BEARING RESTRICTIONS: Yes PWB  FALLS:  Has patient fallen in last 6 months? 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    NEXT MD VISIT: 01/18/23  OBJECTIVE:   DIAGNOSTIC FINDINGS: MRI before surgery (see chart review)  PATIENT SURVEYS:  FOTO 37/67  COGNITION: Overall cognitive status: Within functional limits for tasks assessed     SENSATION: WFL  EDEMA:  Figure 8 R and L 49.5  OBSERVATION: pt's lateral incision site is still healing at distal end  POSTURE: pt arrived wearing CAM walking boot  and using b/l axillary crutches  PALPATION: Mild typical swelling noted along R lateral ankle/malleolar region  LOWER EXTREMITY ROM:  Active ROM Right eval Left eval  Ankle dorsiflexion 5 12  Ankle plantarflexion 40 55  Ankle inversion    Ankle eversion     (Blank rows = not tested)  LOWER EXTREMITY MMT:  MMT Right eval Left eval  Ankle dorsiflexion 5 3+  Ankle plantarflexion 5 2   Ankle inversion 5 3+  Ankle eversion 5 3+   (Blank rows = not tested) tested as a "isometric make test" in neutral position of ankle   TODAY'S TREATMENT:                                                                                                                               DATE: 03/09/2023   Subjective: Pt reports she hasn't done much exercise the past few weeks, she's been very busy with school at work right now.  She feels like the ankle is not as swollen overall.  She does still notice the ankle gets sore with certain movements, like prolonged walking or going up steps, if she sits and rests it gets better.  Did the walking program at her job today and her ankle was a little sore but resolved before PT.  She was contacted by physician about following up and she opted to not f/u at this time as she feels the swelling improved.  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 Pain currently: 0-1/10.  Therapeutic Exercise: (+) mild swelling along R surgical site   Scifit Level 6 B UE/LE 15 min. Discussed daily activity/ exercise.    Prostretch in //-bars: 5x with static holds 20-30 sec.    Standing single leg heel raises R LE x25 with 2 fingers each hand on // bars.  Single leg stance with RDL to tap cone forward 4x4, 1x5  3 way lunge R and L 3 rounds (no shoes on)  Lateral curtsy lunge x8 each side  SLS R on airex with 4-way hip perturbations x30 seconds ea direction, 2 rounds, intermittent UE support with walking stick for balance  Not today: Tandem stance on 1/2 FR: 1 min ea direction, then added PT perturbation (external stimulus) x 1 min ea.- not today STAR Excursion/Y-balance: 3x 3 reps standing on RLE (STAR today)- not today Low intensity agility: (not today) ladder: in/in, out/out moving laterally to L and R x2 ea Double leg jumps: x10 Double leg line jumps A/P: 15 sec intervals x4, lateral: 15 sec intervals x4   Manual Therapy: STM retrograde massage R ankle, anterior tib, plantar fascia Gentle DF MWM x10  Ice to R ankle for swelling/pain control at end of session x 10 min  Pt ed for continuing to ice for swelling and to wear taller sock/compression  with mild compression for edema control this  week    Not today: Single leg balance on  airex: 3x20 sec (increased sway) Lunges with sliders: Single leg stance on R with L foot slide posterior and lateral: to fatigue 1 set each not today Isometric MRE all 4 directions of ankle: 5 second hold x 10 ea Rebounder: Tandem/ SLS/ Airex (NBOS/ tandem/ SLS)/ Reverse BOSU tandem and narrow BOS.  10 throws each with SBA from PT for safety.  3rd size med ball. BOSU squats x15, with 9 # dumbell goblet squats 2x12 Total gym leg press: bilateral 20x/ single leg 15x (level 22 Total gym double leg hop (gravity reduced) x 10 attempt b/l LE  PATIENT EDUCATION:  Education details: HEP, PT POC/goals, reviewed WB precautions Person educated: Patient Education method: Explanation, Demonstration, and Handouts Education comprehension: verbalized understanding and needs further education  HOME EXERCISE PROGRAM: Access Code: [email or web address] URL: [email or web address] Date: 02/07/2023 Prepared by: Max Fickle  Exercises - Standing Gastroc Stretch  - 1 x daily - 7 x weekly - 3 reps - 30 hold - Ankle Dorsiflexion with Resistance  - 1 x daily - 7 x weekly - 3 sets - 10 reps - Single Leg Heel Raise with Chair Support  - 1 x daily - 5 x weekly - 20-25 reps - Lower Quarter Anterior Reach  - 1 x daily - 7 x weekly - 3 sets - 3 reps - Squat  - 1 x daily - 3 x weekly - 3 sets - 10 reps  ASSESSMENT:  CLINICAL IMPRESSION: 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  Was appropriately challenged with SL dynamic balance exercises; did not progress with agility due to swelling.  Would like to see swelling decrease more though before beginning higher level return to sport activities.  She is an appropriate candidate for skilled PT to address the impairments listed below.    OBJECTIVE IMPAIRMENTS: decreased activity tolerance,  decreased balance, decreased endurance, decreased mobility, difficulty walking, decreased ROM, decreased strength, and pain.   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   REHAB POTENTIAL: Excellent  CLINICAL DECISION MAKING: Stable/uncomplicated  EVALUATION COMPLEXITY: Low   GOALS: Goals reviewed with patient? Yes  SHORT TERM GOALS: Target date: 01/20/23 (by her follow up with physician) Pt will be able to amb with normal R heel strike/toe off in sneaker WBAT Baseline: initial eval PWB in CAM boot.  3/22: wearing sneaker with more normalized gait pattern/ decrease DF; 4/3: met Goal status: MET  2.  Improve ankle DF R to >10 deg to facilitate normal gait pattern on flat surfaces  Baseline: initial eval 5 degrees; 4/3: 8 degrees; 02/23/23: 10 deg Goal status: partially met    LONG TERM GOALS: Target date: 04/06/23  Improve R ankle strength to 4/5 to allow for CKC single leg stance dynamic strengthening in preparation for return to PLOF Baseline: currently PWB at initial eval; 4/24: 4/5 DF and PF,  Goal status: MET  2.  Improve FOTO to >50 indicating improved ability to return to PLOF Baseline: 37 (target expected 67) Goal status: INITIAL  3.  Pt will be able to amb on flat/uneven surfaces without any AD x 20 min without any increase in swelling in preparation for higher level  sports specific rehab Baseline: currently PWB; 4/24: mild swelling still present Goal status: INITIAL    PLAN:  PT FREQUENCY: 2x/week  PT DURATION: 8 weeks  PLANNED INTERVENTIONS: Therapeutic exercises, Therapeutic activity, Neuromuscular re-education,  Balance training, Gait training, Patient/Family education, Self Care, and Joint mobilization  PLAN FOR NEXT SESSION: FOTO at next session; continue with progressing proprioception, strength, agility, power to return to running, jumping, basketball recreationally; pt will bring basketball shoes and lace up ankle brace next session- if swelling has improved consider progressing agility and return to run progression  if/when appropriate  Max Fickle, PT, DPT, OCS  [phone]  03/09/2023, 5:51 PM

## 2023-03-16 ENCOUNTER — Ambulatory Visit: Payer: 59

## 2023-03-16 DIAGNOSIS — R29898 Other symptoms and signs involving the musculoskeletal system: Secondary | ICD-10-CM | POA: Diagnosis not present

## 2023-03-16 DIAGNOSIS — M25371 Other instability, right ankle: Secondary | ICD-10-CM

## 2023-03-16 DIAGNOSIS — M25671 Stiffness of right ankle, not elsewhere classified: Secondary | ICD-10-CM | POA: Diagnosis not present

## 2023-03-16 NOTE — Therapy (Signed)
OUTPATIENT PHYSICAL THERAPY LOWER EXTREMITY TREATMENT Patient Name: Robin Harris MRN: [id number] DOB:2001/02/24, 22 y.o., female Today's Date: 03/16/2023  END OF SESSION:  PT End of Session - 03/16/23 1510     Visit Number 15    Number of Visits 28    Date for PT Re-Evaluation 04/06/23    Authorization Type re-cert on 5/78 for 2x/week for 6 weeks    Authorization Time Period PN done on 02/23/23    PT Start Time 1500    PT Stop Time 1555    PT Time Calculation (min) 55 min    Activity Tolerance Patient tolerated treatment well    Behavior During Therapy [facility] for tasks assessed/performed             Past Medical History:  Diagnosis Date   ALL (acute lymphoblastic leukemia) (HCC) 2007   [facility]iannually [facility] Dr[facility] in remission ==> rec yearly CBC, CMP, CPE, and echo Q80yrs - and plz fax to [facility] peds onc (see note dated 10/2012)   Arthritis    pt states she does not have arthritis   Past Surgical History:  Procedure Laterality Date   MYRINGOTOMY  2005   Bilateral   PORT-A-CATH REMOVAL  2010   PORTACATH PLACEMENT  2007   for chemo   TUMOR REMOVAL  2008   benign; behind left ear   Patient Active Problem List   Diagnosis Date Noted   Overweight with body mass index (BMI) of 29 to 29.9 in adult 09/16/2022   History of acute lymphoblastic leukemia (ALL) 10/27/2021    PCP: Nicki Reaper, NP  REFERRING PROVIDER: Ovid Curd, DPM  REFERRING DIAG: [phone]  THERAPY DIAG:  Right ankle instability  Ankle weakness  Stiffness of right ankle, not elsewhere classified  Rationale for Evaluation and Treatment: Rehabilitation  ONSET DATE: chronic, surgery was 11/17/22  SUBJECTIVE:   SUBJECTIVE STATEMENT: 11/17/22 pt had surgery on R ankle for ATFL repair, microfx, removal of loose body via arthroscopic surgery.   H/o multiple sprains leading to chronic swelling and pain.  She is currently PWB in a CAM walking boot.  F/u January 18, 2023.  Currently she reports her  anlke is feeling better.  She is noticing "tightness" in R lateral ankle.  She wears the boot and uses b/l crutches when walking PWB.  Not wearing boot with sleeping or driving.    PERTINENT HISTORY: Pt played basketball in high school.   PAIN:  Are you having pain? No  PRECAUTIONS: Other: PWB in CAM walking boot, can progress to WBAT in boot  WEIGHT BEARING RESTRICTIONS: Yes PWB  FALLS:  Has patient fallen in last 6 months? 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    NEXT MD VISIT: 01/18/23  OBJECTIVE:   DIAGNOSTIC FINDINGS: MRI before surgery (see chart review)  PATIENT SURVEYS:  FOTO 37/67  COGNITION: Overall cognitive status: Within functional limits for tasks assessed     SENSATION: WFL  EDEMA:  Figure 8 R and L 49.5  OBSERVATION: pt's lateral incision site is still healing at distal end  POSTURE: pt arrived wearing CAM walking boot  and using b/l axillary crutches  PALPATION: Mild typical swelling noted along R lateral ankle/malleolar region  LOWER EXTREMITY ROM:  Active ROM Right eval Left eval  Ankle dorsiflexion 5 12  Ankle plantarflexion 40 55  Ankle inversion    Ankle eversion     (Blank rows = not tested)  LOWER EXTREMITY MMT:  MMT Right eval Left eval  Ankle dorsiflexion 5 3+  Ankle plantarflexion 5 2   Ankle inversion 5 3+  Ankle eversion 5 3+   (Blank rows = not tested) tested as a "isometric make test" in neutral position of ankle   TODAY'S TREATMENT:                                                                                                                               DATE: 03/16/2023   Subjective: Pt reports she is still having pain with prolonged walking/standing.  She feels like her ankle is still swelling.  Overall, she feels more stable but she also isn't as active as she has been in the past either.  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  She also feels like lifting heavy things (example: lifting/moving a couch) is painful in her ankle.  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  Pain currently: 4/10 at arrival.  Therapeutic Exercise: (+) mild swelling along R anterior/lateral ankle.   TTP along extensor digitorum longus tendons and with MMT of toe extensors. Recreation of her typical sharp pain anterior to distal lateral malleolus  Scifit Level 6 B UE/LE 10 min. Discussed daily activity/ exercise.    Prostretch in //-bars: 5x with static holds 20-30 sec.    Standing single leg heel raises R LE x25 with 2 fingers each hand on // bars.  Walking lunges: forward 4 laps, forward with trunk rotation twist holding med ball (8 lb) 4 laps   SLS on airex with 3 way toe taps: 3 rounds on L LE, 6 rounds on R LE  Squat overhead press with 8 lb med ball 2x15, x15 with emphasis on power with addition of plantar flexion  Pt donned her basketball shoes and lace up ankle brace; notes pain level still 4/10 Assessed low level agility/power/basketball type drills with her shoes/brace on: Double leg vertical wall taps: 20 second interval x 3 (pain level 5-6/10, after rest then reduced to 4/10 today's baseline, then discontinued) Double leg line jumps A/P: 15 sec intervals x4, lateral: 15 sec intervals x4 (pt notes 4/10 pain) Forward/reverse jog direction changes: x 15 ft, 3 laps (pt notes sharp pain along anterior/lateral ankle with reverse direction and increase to 6/10, discontinued at that time) Lateral shuffles: R and L x  15 ft, 3 laps, (pt notes 4/10 pain specifically sharp pain along anterior/lateral ankle with moving in R direction landing on R ankle)  Ice to R ankle for swelling/pain control at end of session x 10 min  Pt ed for continuing to ice for swelling and to wear taller sock/compression  with mild compression for edema control this week   Not today: Tandem stance on 1/2 FR: 1 min ea direction, then added PT perturbation (external stimulus) x 1 min ea.- not today STAR Excursion/Y-balance: 3x 3 reps standing on RLE (STAR today)- not today Low intensity agility: (not today) ladder: in/in, out/out moving laterally to L and R x2 ea Single leg stance with RDL to tap cone forward 4x4, 1x5 3 way lunge R and L 3 rounds (no shoes on) Lateral curtsy lunge x8 each side SLS R on airex with 4-way hip perturbations x30 seconds ea direction, 2 rounds, intermittent UE support with walking stick for balance  Single leg balance on airex: 3x20 sec (increased sway) Lunges with sliders: Single leg stance on R with L foot slide posterior and lateral: to fatigue 1 set each not today Isometric MRE all 4 directions of ankle: 5 second hold x 10 ea Rebounder: Tandem/ SLS/ Airex (NBOS/ tandem/ SLS)/ Reverse BOSU tandem and narrow BOS.  10 throws each with SBA from PT for safety.  3rd size med ball. BOSU squats x15, with 9 # dumbell goblet squats 2x12 Total gym leg press: bilateral 20x/ single leg 15x (level 22 Total gym double leg hop (gravity reduced) x 10 attempt b/l LE Manual Therapy: STM retrograde massage R ankle, anterior tib, plantar fascia Gentle DF MWM x10  PATIENT EDUCATION:  Education details: HEP, PT POC/goals, reviewed WB precautions Person educated: Patient Education method: Explanation, Demonstration, and Handouts Education comprehension: verbalized understanding and needs further education  HOME EXERCISE PROGRAM: Access Code: [email or web address] URL: [email or web address] Date:  02/07/2023 Prepared by: Max Fickle  Exercises - Standing Gastroc Stretch  - 1 x daily - 7 x weekly - 3 reps - 30 hold - Ankle Dorsiflexion with Resistance  - 1 x daily - 7 x weekly - 3 sets - 10 reps - Single Leg Heel Raise with Chair Support  - 1 x daily - 5 x weekly - 20-25 reps - Lower Quarter Anterior Reach  - 1 x daily - 7 x weekly - 3 sets - 3 reps - Squat  - 1 x daily - 3 x weekly - 3 sets - 10 reps  ASSESSMENT:  CLINICAL IMPRESSION: Pt arrived with mild R ankle swelling and pain levels noted at 4/10.  Continued with strengthening and SLS proprioception without increased pain.  Attempts at progressing to low level agility/dynamic activities (jumping/running/direction changes) even with wearing her supportive shoes and her lace up ankle brace are limited by pain and swelling at this time.  Would like to see less swelling and reduced pain before continuing with advanced phases of rehab.  Advised her to follow through on following up with her physician for further evaluation and to further discuss these concerns of continued pain and swelling.  Will follow up after she sees her referring provider next week.   OBJECTIVE IMPAIRMENTS: decreased activity tolerance, decreased balance, decreased endurance, decreased mobility, difficulty walking, decreased ROM, decreased strength, and pain.   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   REHAB POTENTIAL: Excellent  CLINICAL DECISION MAKING: Stable/uncomplicated  EVALUATION  COMPLEXITY: Low   GOALS: Goals reviewed with patient? Yes  SHORT TERM GOALS: Target date: 01/20/23 (by her follow  up with physician) Pt will be able to amb with normal R heel strike/toe off in sneaker WBAT Baseline: initial eval PWB in CAM boot.  3/22: wearing sneaker with more normalized gait pattern/ decrease DF; 4/3: met Goal status: MET  2.  Improve ankle DF R to >10 deg to facilitate normal gait pattern on flat surfaces  Baseline: initial eval 5 degrees; 4/3: 8 degrees; 02/23/23: 10 deg Goal status: partially met    LONG TERM GOALS: Target date: 04/06/23  Improve R ankle strength to 4/5 to allow for CKC single leg stance dynamic strengthening in preparation for return to PLOF Baseline: currently PWB at initial eval; 4/24: 4/5 DF and PF,  Goal status: MET  2.  Improve FOTO to >50 indicating improved ability to return to PLOF Baseline: 37 (target expected 67) Goal status: INITIAL  3.  Pt will be able to amb on flat/uneven surfaces without any AD x 20 min without any increase in swelling in preparation for higher level sports specific rehab Baseline: currently PWB; 4/24: mild swelling still present Goal status: INITIAL    PLAN:  PT FREQUENCY: 2x/week  PT DURATION: 8 weeks  PLANNED INTERVENTIONS: Therapeutic exercises, Therapeutic activity, Neuromuscular re-education, Balance training, Gait training, Patient/Family education, Self Care, and Joint mobilization  PLAN FOR NEXT SESSION: FOTO at next session; continue with progressing proprioception, strength, agility, power to return to running, jumping, basketball recreationally; pt will bring basketball shoes and lace up ankle brace next session- if swelling has improved consider progressing agility and return to run progression  if/when appropriate  Max Fickle, PT, DPT, OCS  [phone]  03/16/2023, 6:04 PM

## 2023-03-21 ENCOUNTER — Other Ambulatory Visit (HOSPITAL_COMMUNITY): Payer: Self-pay

## 2023-03-25 ENCOUNTER — Ambulatory Visit (INDEPENDENT_AMBULATORY_CARE_PROVIDER_SITE_OTHER): Payer: 59

## 2023-03-25 ENCOUNTER — Other Ambulatory Visit (HOSPITAL_COMMUNITY): Payer: Self-pay

## 2023-03-25 ENCOUNTER — Other Ambulatory Visit: Payer: Self-pay

## 2023-03-25 ENCOUNTER — Ambulatory Visit (INDEPENDENT_AMBULATORY_CARE_PROVIDER_SITE_OTHER): Payer: 59 | Admitting: Podiatry

## 2023-03-25 DIAGNOSIS — M25371 Other instability, right ankle: Secondary | ICD-10-CM | POA: Diagnosis not present

## 2023-03-25 MED ORDER — MELOXICAM 7.5 MG PO TABS
7.5000 mg | ORAL_TABLET | Freq: Every day | ORAL | 0 refills | Status: DC | PRN
  Filled 2023-03-25 – 2023-04-05 (×2): qty 30, 30d supply, fill #0

## 2023-03-25 NOTE — Progress Notes (Signed)
Subjective:   Patient ID: Robin Harris, female   DOB: 22 y.o.   MRN: [id number]   HPI Chief Complaint  Patient presents with   Follow-up    Ankle instability, right ankle. Patient continues to go to PT since February. Patient complaining of achiness and swelling to the right ankle. X-rays obtained today.     DOS: 11/17/2022 Procedure: Left ankle arthroscopy with microfracture, removal of loose body; ATFL repair   22 year old female presents with her mom for the above concerns.  She has been doing physical therapy with some concern for swelling.  She has not been taking any anti-inflammatories or compression.  She did have 1 small injury to the area where she twisted that but she is able to walk on it.       Objective:  Physical Exam  General: AAO x3, NAD  Dermatological: Incision well coapted and scar has formed. No erythema warmth or any signs of infection.  There is no open lesions.  Vascular: Dorsalis Pedis artery and Posterior Tibial artery pedal pulses are 2/4 bilateral with immedate capillary fill time. There is no pain with calf compression, swelling, warmth, erythema.   Neruologic: Grossly intact via light touch bilateral.   Musculoskeletal: There is still some mild discomfort along the ATFL site she describes it more but she is vague.  There is edema present.  No erythema or warmth or any signs of infection.       Assessment:   21 year old female with right lateral ankle instability     Plan:  -Treatment options discussed including all alternatives, risks, and complications -Etiology of symptoms were discussed -X-rays were obtained reviewed of the ankle.  3 views were obtained.  No evidence of acute fracture.  Some small calcifications noted along the lateral ankle gutter.  No acute changes noted. -I dispensed a compression anklet to help with swelling.  Recommend anti-inflammatories as well. Prescribed mobic. Discussed side effects of the medication and directed  to stop if any are to occur and call the office.  -Trilock ankle brace prn. -Discussed supportive shoe gear. -Continue PT  Vivi Barrack DPM

## 2023-03-25 NOTE — Patient Instructions (Signed)
For instructions on how to put on your Tri-Lock Ankle Brace, please visit [email or web address] 

## 2023-04-01 ENCOUNTER — Ambulatory Visit: Payer: 59

## 2023-04-01 DIAGNOSIS — M25371 Other instability, right ankle: Secondary | ICD-10-CM

## 2023-04-01 DIAGNOSIS — R29898 Other symptoms and signs involving the musculoskeletal system: Secondary | ICD-10-CM

## 2023-04-01 DIAGNOSIS — M25671 Stiffness of right ankle, not elsewhere classified: Secondary | ICD-10-CM

## 2023-04-01 NOTE — Therapy (Addendum)
OUTPATIENT PHYSICAL THERAPY LOWER EXTREMITY TREATMENT Patient Name: Robin Harris MRN: [id number] DOB:2001-09-05, 22 y.o., female Today's Date: 04/01/2023  END OF SESSION:  PT End of Session - 04/01/23 0815     Visit Number 16    Number of Visits 28    Date for PT Re-Evaluation 04/06/23    Authorization Type re-cert on 1/19 for 2x/week for 6 weeks    Authorization Time Period PN done on 02/23/23    PT Start Time 0815    PT Stop Time 0915    PT Time Calculation (min) 60 min    Activity Tolerance Patient tolerated treatment well    Behavior During Therapy [facility] for tasks assessed/performed             Past Medical History:  Diagnosis Date   ALL (acute lymphoblastic leukemia) (HCC) 2007   semiannually sees Dr. Doylene Canning [facility]), in remission ==> rec yearly CBC, CMP, CPE, and echo Q20yrs - and plz fax to [facility] peds onc (see note dated 10/2012)   Arthritis    pt states she does not have arthritis   Past Surgical History:  Procedure Laterality Date   MYRINGOTOMY  2005   Bilateral   PORT-A-CATH REMOVAL  2010   PORTACATH PLACEMENT  2007   for chemo   TUMOR REMOVAL  2008   benign; behind left ear   Patient Active Problem List   Diagnosis Date Noted   Overweight with body mass index (BMI) of 29 to 29.9 in adult 09/16/2022   History of acute lymphoblastic leukemia (ALL) 10/27/2021    PCP: Nicki Reaper, NP  REFERRING PROVIDER: Ovid Curd, DPM  REFERRING DIAG: [phone]  THERAPY DIAG:  Right ankle instability  Ankle weakness  Stiffness of right ankle, not elsewhere classified  Rationale for Evaluation and Treatment: Rehabilitation  ONSET DATE: chronic, surgery was 11/17/22  SUBJECTIVE:   SUBJECTIVE STATEMENT: 11/17/22 pt had surgery on R ankle for ATFL repair, microfx, removal of loose body via arthroscopic surgery.   H/o multiple sprains leading to chronic swelling and pain.  She is currently PWB in a CAM walking boot.  F/u January 18, 2023.  Currently she reports her  anlke is feeling better.  She is noticing "tightness" in R lateral ankle.  She wears the boot and uses b/l crutches when walking PWB.  Not wearing boot with sleeping or driving.    PERTINENT HISTORY: Pt played basketball in high school.   PAIN:  Are you having pain? No  PRECAUTIONS: Other: PWB in CAM walking boot, can progress to WBAT in boot  WEIGHT BEARING RESTRICTIONS: Yes PWB  FALLS:  Has patient fallen in last 6 months? 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    NEXT MD VISIT: 01/18/23  OBJECTIVE:   DIAGNOSTIC FINDINGS: MRI before surgery (see chart review)  PATIENT SURVEYS:  FOTO 37/67  COGNITION: Overall cognitive status: Within functional limits for tasks assessed     SENSATION: WFL  EDEMA:  Figure 8 R and L 49.5  OBSERVATION: pt's lateral incision site is still healing at distal end  POSTURE: pt arrived wearing CAM walking boot  and using b/l axillary crutches  PALPATION: Mild typical swelling noted along R lateral ankle/malleolar region  LOWER EXTREMITY ROM:  Active ROM Right eval Left eval  Ankle dorsiflexion 5 12  Ankle plantarflexion 40 55  Ankle inversion    Ankle eversion     (Blank rows = not tested)  LOWER EXTREMITY MMT:  MMT Right eval Left eval  Ankle dorsiflexion 5 3+  Ankle plantarflexion 5 2   Ankle inversion 5 3+  Ankle eversion 5 3+   (Blank rows = not tested) tested as a "isometric make test" in neutral position of ankle   TODAY'S TREATMENT:                                                                                                                               DATE: 04/01/2023   Subjective: 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  She reports her ankle felt "good" during the first session and then her ankle really bothered her during the second session.  She had f/u with her doctor last week and started wearing a medical grade ankle compression sleeve for swelling management; discussed other options like steroids, MRI, and she is not pursuing those now.  She would like to continue conservative approach to sx management.  She has worn her compression sleeve while standing/walking at work but her foot "goes numb", so she stopped wearing it.  Manual therapy: for pain, swelling control (30 min)  Figure 8 ankle measurements:  At start of session: R 52 cm, L 50 cm At end of session: R 50.5 cm, L 50 cm  STM- retrograde extensor digitorum longus/brevis, ant tib, peroneals Scar mob- all directions Manual toe flexion PROM (for ROM and neuromobilization deep/superficial peroneal n)x10 Gr 1/2 T-C DF mob, MWM DF DF manual stretch 30 seconds x 4   Therapeutic Exercise: MRE ankle DF, PF, Ev, In (partial ROM): 5 seconds x 15 ea Standing SLS full WB 3 x 1 min emphasis on upright posture, toe contact with floor, ankle posture (no socks on)   Pt education: Discussed importance of continuing to wear compression sleeve for managing and getting ahead of swelling; discussed full sock version of the toeless version she has; discussed wearing her ankle brace/basketball shoes 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   Ice to R ankle for swelling/pain control at end of session x 10 min    Not today: Scifit Level 6 B UE/LE 10 min. Discussed daily activity/ exercise.    Prostretch in //-bars: 5x with static holds 20-30 sec.    Standing single leg heel raises R LE x25 with 2 fingers each hand on // bars.  Walking lunges: forward 4 laps, forward with trunk rotation twist holding med ball (8 lb) 4 laps    SLS on airex with 3 way toe taps: 3 rounds on L LE, 6 rounds on R LE  Squat overhead press with 8  lb med ball 2x15, x15 with emphasis on power with addition of plantar flexion Assessed low level agility/power/basketball type drills with her shoes/brace on: Double leg vertical wall taps: 20 second interval x 3 (pain level 5-6/10, after rest then reduced to 4/10 today's baseline, then discontinued) Double leg line jumps A/P: 15 sec intervals x4, lateral: 15 sec intervals x4 (pt notes 4/10 pain) Forward/reverse jog direction changes: x 15 ft, 3 laps (pt notes sharp pain along anterior/lateral ankle with reverse direction and increase to 6/10, discontinued at that time) Lateral shuffles: R and L x 15 ft, 3 laps, (pt notes 4/10 pain specifically sharp pain along anterior/lateral ankle with moving in R direction landing on R ankle) Tandem stance on 1/2 FR: 1 min ea direction, then added PT perturbation (external stimulus) x 1 min ea.- not today STAR Excursion/Y-balance: 3x 3 reps standing on RLE (STAR today)- not today Low intensity agility: (not today) ladder: in/in, out/out moving laterally to L and R x2 ea Single leg stance with RDL to tap cone forward 4x4, 1x5 3 way lunge R and L 3 rounds (no shoes on) Lateral curtsy lunge x8 each side SLS R on airex with 4-way hip perturbations x30 seconds ea direction, 2 rounds, intermittent UE support with walking stick for balance  Single leg balance on airex: 3x20 sec (increased sway) Lunges with sliders: Single leg stance on R with L foot slide posterior and lateral: to fatigue 1 set each not today Isometric MRE all 4 directions of ankle: 5 second hold x 10 ea Rebounder: Tandem/ SLS/ Airex (NBOS/ tandem/ SLS)/ Reverse BOSU tandem and narrow BOS.  10 throws each with SBA from PT for safety.  3rd size med ball. BOSU squats x15, with 9 # dumbell goblet squats 2x12 Total gym leg press: bilateral 20x/ single leg 15x (level 22 Total gym double leg hop  (gravity reduced) x 10 attempt b/l LE Manual Therapy: STM retrograde massage R ankle, anterior tib, plantar fascia Gentle DF MWM x10  PATIENT EDUCATION:  Education details: HEP, PT POC/goals, reviewed WB precautions Person educated: Patient Education method: Explanation, Demonstration, and Handouts Education comprehension: verbalized understanding and needs further education  HOME EXERCISE PROGRAM: Access Code: [email or web address] URL: [email or web address] Date: 02/07/2023 Prepared by: Max Fickle  Exercises - Standing Gastroc Stretch  - 1 x daily - 7 x weekly - 3 reps - 30 hold - Ankle Dorsiflexion with Resistance  - 1 x daily - 7 x weekly - 3 sets - 10 reps - Single Leg Heel Raise with Chair Support  - 1 x daily - 5 x weekly - 20-25 reps - Lower Quarter Anterior Reach  - 1 x daily - 7 x weekly - 3 sets - 3 reps - Squat  - 1 x daily - 3 x weekly - 3 sets - 10 reps  ASSESSMENT:  CLINICAL IMPRESSION: Pt's R ankle swelling improved 1.5 cm (measured via figure 8) from start to end of session today.  Discussed importance of continuing to focus on swelling control and reduction to facilitate optimal long term functional outcomes.  Plan to continue with skilled PT interventions with the emphasis on monitoring swelling and swelling reduction during sessions while gradually resuming higher level therapeutic exercises for ankle/LE strength/power, endurance, and proprioception to promote return to PLOF.  OBJECTIVE IMPAIRMENTS: decreased activity tolerance, decreased balance, decreased endurance, decreased mobility, difficulty walking, decreased ROM, decreased strength, and pain.   ACTIVITY LIMITATIONS: carrying, lifting, standing, squatting, stairs, and locomotion level, higher level physical activity associated  with her job/exercise science college program  PARTICIPATION LIMITATIONS: community activity and occupation  PERSONAL FACTORS: Fitness, Past/current experiences, and Time  since onset of injury/illness/exacerbation are also affecting patient's functional outcome.   REHAB POTENTIAL: Excellent  CLINICAL DECISION MAKING: Stable/uncomplicated  EVALUATION COMPLEXITY: Low   GOALS: Goals reviewed with patient? Yes  SHORT TERM GOALS: Target date: 01/20/23 (by her follow up with physician) Pt will be able to amb with normal R heel strike/toe off in sneaker WBAT Baseline: initial eval PWB in CAM boot.  3/22: wearing sneaker with more normalized gait pattern/ decrease DF; 4/3: met Goal status: MET  2.  Improve ankle DF R to >10 deg to facilitate normal gait pattern on flat surfaces  Baseline: initial eval 5 degrees; 4/3: 8 degrees; 02/23/23: 10 deg Goal status: partially met    LONG TERM GOALS: Target date: 04/06/23  Improve R ankle strength to 4/5 to allow for CKC single leg stance dynamic strengthening in preparation for return to PLOF Baseline: currently PWB at initial eval; 4/24: 4/5 DF and PF,  Goal status: MET  2.  Improve FOTO to >50 indicating improved ability to return to PLOF Baseline: 37 (target expected 67) Goal status: INITIAL  3.  Pt will be able to amb on flat/uneven surfaces without any AD x 20 min without any increase in swelling in preparation for higher level sports specific rehab Baseline: currently PWB; 4/24: mild swelling still present Goal status: INITIAL    PLAN:  PT FREQUENCY: 2x/week  PT DURATION: 8 weeks  PLANNED INTERVENTIONS: Therapeutic exercises, Therapeutic activity, Neuromuscular re-education, Balance training, Gait training, Patient/Family education, Self Care, and Joint mobilization  PLAN FOR NEXT SESSION: assess how the compression sleeve has been going; continue measuring swelling via figure 8 and beginning and end of session; PN/re-cert needed 11/06/08  Max Fickle, PT, DPT, OCS  [phone]  04/01/2023, 9:48 AM

## 2023-04-04 ENCOUNTER — Other Ambulatory Visit: Payer: Self-pay

## 2023-04-05 ENCOUNTER — Other Ambulatory Visit: Payer: Self-pay

## 2023-04-15 ENCOUNTER — Telehealth: Payer: 59 | Admitting: Physician Assistant

## 2023-04-15 ENCOUNTER — Other Ambulatory Visit: Payer: Self-pay

## 2023-04-15 DIAGNOSIS — H9202 Otalgia, left ear: Secondary | ICD-10-CM

## 2023-04-15 DIAGNOSIS — B9689 Other specified bacterial agents as the cause of diseases classified elsewhere: Secondary | ICD-10-CM | POA: Diagnosis not present

## 2023-04-15 DIAGNOSIS — J019 Acute sinusitis, unspecified: Secondary | ICD-10-CM | POA: Diagnosis not present

## 2023-04-15 MED ORDER — FLUTICASONE PROPIONATE 50 MCG/ACT NA SUSP
2.0000 | Freq: Every day | NASAL | 0 refills | Status: DC
Start: 2023-04-15 — End: 2023-09-19
  Filled 2023-04-15: qty 16, 30d supply, fill #0

## 2023-04-15 MED ORDER — AMOXICILLIN-POT CLAVULANATE 875-125 MG PO TABS
1.0000 | ORAL_TABLET | Freq: Two times a day (BID) | ORAL | 0 refills | Status: DC
  Filled 2023-04-15: qty 14, 7d supply, fill #0

## 2023-04-15 NOTE — Progress Notes (Signed)

## 2023-06-16 ENCOUNTER — Other Ambulatory Visit: Payer: Self-pay

## 2023-06-16 ENCOUNTER — Other Ambulatory Visit (HOSPITAL_COMMUNITY): Payer: Self-pay

## 2023-06-16 ENCOUNTER — Other Ambulatory Visit: Payer: Self-pay | Admitting: Internal Medicine

## 2023-06-17 ENCOUNTER — Other Ambulatory Visit (HOSPITAL_COMMUNITY): Payer: Self-pay

## 2023-06-17 MED ORDER — NORGESTIM-ETH ESTRAD TRIPHASIC 0.18/0.215/0.25 MG-25 MCG PO TABS
1.0000 | ORAL_TABLET | Freq: Every day | ORAL | 2 refills | Status: DC
  Filled 2023-07-28: qty 28, 28d supply, fill #0
  Filled 2023-08-08: qty 84, 84d supply, fill #0

## 2023-06-17 NOTE — Telephone Encounter (Signed)
Patient will need to schedule an office visit for additional refills. Requested Prescriptions  Pending Prescriptions Disp Refills   Norgestimate-Ethinyl Estradiol Triphasic (TRI-VYLIBRA LO) 0.18/0.215/0.25 MG-25 MCG tab 28 tablet 2    Sig: Take 1 tablet by mouth daily.     OB/GYN:  Contraceptives Passed - 06/16/2023  5:32 PM      Passed - Last BP in normal range    BP Readings from Last 1 Encounters:  12/14/22 128/80         Passed - Valid encounter within last 12 months    Recent Outpatient Visits           9 months ago Encounter for general adult medical examination with abnormal findings   [place] [facility] [place], Salvadore Oxford, NP   11 months ago Sore throat   [place] [facility] [place], Oswaldo Conroy, [place]   1 year ago Rash of hands   [place] [facility] [place], Salvadore Oxford, NP   1 year ago Rash of hands   [place] [facility] [place], Salvadore Oxford, NP   1 year ago History of acute lymphoblastic leukemia (ALL)   [place] [facility] [place], Salvadore Oxford, [place]              Passed - Patient is not a smoker

## 2023-06-28 ENCOUNTER — Encounter: Payer: Self-pay | Admitting: Internal Medicine

## 2023-07-28 ENCOUNTER — Other Ambulatory Visit: Payer: Self-pay | Admitting: Internal Medicine

## 2023-07-28 ENCOUNTER — Other Ambulatory Visit: Payer: Self-pay

## 2023-07-28 ENCOUNTER — Encounter: Payer: 59 | Admitting: Internal Medicine

## 2023-07-28 NOTE — Progress Notes (Deleted)
Subjective:    Patient ID: Robin Harris, female    DOB: 05/10/2001, 22 y.o.   MRN: [id number]  HPI  Patient presents to clinic today for her annual exam.  Flu: 07/2022 Tetanus: 05/2015 COVID: Dentist:  Diet: Exercise:  Review of Systems     Past Medical History:  Diagnosis Date   ALL (acute lymphoblastic leukemia) (HCC) 2007   semiannually sees Dr. Doylene Canning [facility]), in remission ==> rec yearly CBC, CMP, CPE, and echo Q67yrs - and plz fax to [facility] peds onc (see note dated 10/2012)   Arthritis    pt states she does not have arthritis    Current Outpatient Medications  Medication Sig Dispense Refill   amoxicillin-clavulanate (AUGMENTIN) 875-125 MG tablet Take 1 tablet by mouth 2 (two) times daily. 14 tablet 0   clobetasol cream (TEMOVATE) 0.05 % Apply to affected area as directed daily up to 5 days a week to eczema on hands and arms as needed for flares, avoid face, groin, axilla 60 g 2   fluticasone (FLONASE) 50 MCG/ACT nasal spray Place 2 sprays into both nostrils daily. 16 g 0   ibuprofen (ADVIL) 800 MG tablet Take 1 tablet (800 mg total) by mouth 3 (three) times daily. 90 tablet 5   meloxicam (MOBIC) 7.5 MG tablet Take 1 tablet (7.5 mg total) by mouth daily as needed for pain. 30 tablet 0   Norgestimate-Ethinyl Estradiol Triphasic (TRI-VYLIBRA LO) 0.18/0.215/0.25 MG-25 MCG tab Take 1 tablet by mouth daily. 28 tablet 2   oxyCODONE-acetaminophen (PERCOCET/ROXICET) 5-325 MG tablet Take 1-2 tablets by mouth every 6 (six) hours as needed for severe pain. 25 tablet 0   promethazine (PHENERGAN) 25 MG tablet Take 1 tablet (25 mg total) by mouth every 8 (eight) hours as needed for nausea or vomiting. 20 tablet 0   triamcinolone cream (KENALOG) 0.1 % Apply 1 application. topically 2 (two) times daily. 30 g 0   No current facility-administered medications for this visit.    Allergies  Allergen Reactions   Sulfamethoxazole-Trimethoprim Rash    Family History  Problem Relation Age of  Onset   Diabetes Maternal Grandmother    Hyperlipidemia Maternal Grandmother    Coronary artery disease Neg Hx    Stroke Neg Hx     Social History   Socioeconomic History   Marital status: Single    Spouse name: Not on file   Number of children: Not on file   Years of education: Not on file   Highest education level: Not on file  Occupational History   Not on file  Tobacco Use   Smoking status: Never   Smokeless tobacco: Never  Vaping Use   Vaping status: Never Used  Substance and Sexual Activity   Alcohol use: No   Drug use: No   Sexual activity: Never  Other Topics Concern   Not on file  Social History Narrative   Lives with mom and sister [name]).  No pets.   No smokers at home.   Social Determinants of Health   Financial Resource Strain: Not on file  Food Insecurity: Not on file  Transportation Needs: Not on file  Physical Activity: Not on file  Stress: Not on file  Social Connections: Not on file  Intimate Partner Violence: Not on file     Constitutional: Denies fever, malaise, fatigue, headache or abrupt weight changes.  HEENT: Denies eye pain, eye redness, ear pain, ringing in the ears, wax buildup, runny nose, nasal congestion, bloody nose, or sore  throat. Respiratory: Denies difficulty breathing, shortness of breath, cough or sputum production.   Cardiovascular: Denies chest pain, chest tightness, palpitations or swelling in the hands or feet.  Gastrointestinal: Denies abdominal pain, bloating, constipation, diarrhea or blood in the stool.  GU: Denies urgency, frequency, pain with urination, burning sensation, blood in urine, odor or discharge. Musculoskeletal: Denies decrease in range of motion, difficulty with gait, muscle pain or joint pain and swelling.  Skin: Denies redness, rashes, lesions or ulcercations.  Neurological: Denies dizziness, difficulty with memory, difficulty with speech or problems with balance and coordination.  Psych: Denies anxiety,  depression, SI/HI.  No other specific complaints in a complete review of systems (except as listed in HPI above).  Objective:   Physical Exam   There were no vitals taken for this visit. Wt Readings from Last 3 Encounters:  09/16/22 163 lb (73.9 kg) (90%, Z= 1.27)*  07/19/22 166 lb 6.4 oz (75.5 kg) (91%, Z= 1.37)*  02/23/22 167 lb 6.4 oz (75.9 kg) (92%, Z= 1.42)*   * Growth percentiles are based on CDC (Girls, 2-20 Years) data.    General: Appears their stated age, well developed, well nourished in NAD. Skin: Warm, dry and intact. No rashes, lesions or ulcerations noted. HEENT: Head: normal shape and size; Eyes: sclera white, no icterus, conjunctiva pink, PERRLA and EOMs intact; Ears: Tm's gray and intact, normal light reflex; Nose: mucosa pink and moist, septum midline; Throat/Mouth: Teeth present, mucosa pink and moist, no exudate, lesions or ulcerations noted.  Neck:  Neck supple, trachea midline. No masses, lumps or thyromegaly present.  Cardiovascular: Normal rate and rhythm. S1,S2 noted.  No murmur, rubs or gallops noted. No JVD or BLE edema. No carotid bruits noted. Pulmonary/Chest: Normal effort and positive vesicular breath sounds. No respiratory distress. No wheezes, rales or ronchi noted.  Abdomen: Soft and nontender. Normal bowel sounds. No distention or masses noted. Liver, spleen and kidneys non palpable. Musculoskeletal: Normal range of motion. No signs of joint swelling. No difficulty with gait.  Neurological: Alert and oriented. Cranial nerves II-XII grossly intact. Coordination normal.  Psychiatric: Mood and affect normal. Behavior is normal. Judgment and thought content normal.    BMET    Component Value Date/Time   NA 142 09/16/2022 1107   K 4.3 09/16/2022 1107   CL 110 09/16/2022 1107   CO2 25 09/16/2022 1107   GLUCOSE 93 09/16/2022 1107   BUN 9 09/16/2022 1107   CREATININE 1.05 (H) 09/16/2022 1107   CALCIUM 9.1 09/16/2022 1107    Lipid Panel      Component Value Date/Time   CHOL 208 (H) 09/16/2022 1107   TRIG 87 09/16/2022 1107   HDL 42 (L) 09/16/2022 1107   CHOLHDL 5.0 (H) 09/16/2022 1107   LDLCALC 147 (H) 09/16/2022 1107    CBC    Component Value Date/Time   WBC 4.7 09/16/2022 1107   RBC 4.35 09/16/2022 1107   HGB 14.2 09/16/2022 1107   HCT 41.3 09/16/2022 1107   PLT 204 09/16/2022 1107   MCV 94.9 09/16/2022 1107   MCH 32.6 09/16/2022 1107   MCHC 34.4 09/16/2022 1107   RDW 11.8 09/16/2022 1107   LYMPHSABS 944 (L) 10/27/2021 1120   MONOABS 0.5 11/14/2017 1418   EOSABS 230 10/27/2021 1120   BASOSABS 20 10/27/2021 1120    Hgb A1C Lab Results  Component Value Date   HGBA1C 5.1 09/16/2022           Assessment & Plan:   Preventative health maintenance:  Flu Tetanus UTD Encouraged her to get her COVID-vaccine Encouraged her to consume a balanced diet and exercise regimen Advised her to see an eye doctor and dentist annually We will check CBC, c-Met, lipid, A1c today  RTC in 1 year for your annual exam Nicki Reaper, NP

## 2023-07-29 ENCOUNTER — Other Ambulatory Visit: Payer: Self-pay

## 2023-07-29 ENCOUNTER — Other Ambulatory Visit: Payer: Self-pay | Admitting: Internal Medicine

## 2023-07-29 MED FILL — Triamcinolone Acetonide Cream 0.1%: CUTANEOUS | 20 days supply | Qty: 30 | Fill #0 | Status: CN

## 2023-07-29 NOTE — Telephone Encounter (Signed)
Requested medications are due for refill today.  yes  Requested medications are on the active medications list.  yes  Last refill. 02/23/2022 30g 0 rf  Future visit scheduled.   yes  Notes to clinic.  Refill/refusal not delegated.    Requested Prescriptions  Pending Prescriptions Disp Refills   triamcinolone cream (KENALOG) 0.1 % 30 g 0    Sig: Apply 1 application. topically 2 (two) times daily.     Not Delegated - Dermatology:  Corticosteroids Failed - 07/29/2023  8:24 AM      Failed - This refill cannot be delegated      Passed - Valid encounter within last 12 months    Recent Outpatient Visits           10 months ago Encounter for general adult medical examination with abnormal findings   [facility] [place], Salvadore Oxford, NP   1 year ago Sore throat   [place] [facility] [place], Oswaldo Conroy, [place]   1 year ago Rash of hands   [place] [facility] [place], Salvadore Oxford, NP   1 year ago Rash of hands   [place] [facility] [place], Salvadore Oxford, NP   1 year ago History of acute lymphoblastic leukemia (ALL)   [place] [facility] [place], Salvadore Oxford, NP       Future Appointments             In 1 month Baity, Salvadore Oxford, NP [facility], [facility]

## 2023-07-29 NOTE — Telephone Encounter (Signed)
Requested medication (s) are due for refill today:   provider to review  Requested medication (s) are on the active medication list:   Yes  Future visit scheduled:   Yes   Last ordered: 02/23/2022 30 g, 0 refills  Non delegated refill    Requested Prescriptions  Pending Prescriptions Disp Refills   triamcinolone cream (KENALOG) 0.1 % 30 g 0    Sig: Apply 1 Application topically 2 (two) times daily.     Not Delegated - Dermatology:  Corticosteroids Failed - 07/28/2023  1:25 PM      Failed - This refill cannot be delegated      Passed - Valid encounter within last 12 months    Recent Outpatient Visits           10 months ago Encounter for general adult medical examination with abnormal findings   [place] [facility] [place], Salvadore Oxford, NP   1 year ago Sore throat   [place] [facility] [place], Oswaldo Conroy, [place]   1 year ago Rash of hands   [place] [facility] [place], Salvadore Oxford, NP   1 year ago Rash of hands   [place] [facility] [place], Salvadore Oxford, NP   1 year ago History of acute lymphoblastic leukemia (ALL)   [place] [facility] [place], Salvadore Oxford, NP       Future Appointments             In 1 month Baity, Salvadore Oxford, NP  [facility], [facility]

## 2023-08-08 ENCOUNTER — Other Ambulatory Visit: Payer: Self-pay

## 2023-08-08 MED FILL — Triamcinolone Acetonide Cream 0.1%: CUTANEOUS | 30 days supply | Qty: 30 | Fill #0 | Status: AC

## 2023-08-09 ENCOUNTER — Other Ambulatory Visit: Payer: Self-pay

## 2023-08-09 ENCOUNTER — Other Ambulatory Visit (HOSPITAL_COMMUNITY): Payer: Self-pay

## 2023-08-09 ENCOUNTER — Encounter: Payer: Self-pay | Admitting: Pharmacist

## 2023-08-26 ENCOUNTER — Other Ambulatory Visit: Payer: Self-pay

## 2023-08-26 MED ORDER — FLULAVAL 0.5 ML IM SUSY
PREFILLED_SYRINGE | INTRAMUSCULAR | 0 refills | Status: DC
  Filled 2023-08-26: qty 0.5, 1d supply, fill #0

## 2023-09-19 ENCOUNTER — Other Ambulatory Visit (HOSPITAL_COMMUNITY)
Admission: RE | Admit: 2023-09-19 | Discharge: 2023-09-19 | Disposition: A | Discharge status: Home / Self Care | Payer: 59 | Source: Ambulatory Visit | Attending: Internal Medicine | Admitting: Internal Medicine

## 2023-09-19 ENCOUNTER — Ambulatory Visit (INDEPENDENT_AMBULATORY_CARE_PROVIDER_SITE_OTHER): Payer: 59 | Admitting: Internal Medicine

## 2023-09-19 ENCOUNTER — Encounter: Payer: Self-pay | Admitting: Internal Medicine

## 2023-09-19 VITALS — BP 100/72 | HR 86 | Ht 62.0 in | Wt 170.8 lb

## 2023-09-19 DIAGNOSIS — Z0001 Encounter for general adult medical examination with abnormal findings: Secondary | ICD-10-CM | POA: Diagnosis not present

## 2023-09-19 DIAGNOSIS — R739 Hyperglycemia, unspecified: Secondary | ICD-10-CM

## 2023-09-19 DIAGNOSIS — E7800 Pure hypercholesterolemia, unspecified: Secondary | ICD-10-CM | POA: Diagnosis not present

## 2023-09-19 DIAGNOSIS — Z856 Personal history of leukemia: Secondary | ICD-10-CM | POA: Diagnosis not present

## 2023-09-19 DIAGNOSIS — Z6831 Body mass index (BMI) 31.0-31.9, adult: Secondary | ICD-10-CM

## 2023-09-19 DIAGNOSIS — E66811 Obesity, class 1: Secondary | ICD-10-CM | POA: Diagnosis not present

## 2023-09-19 DIAGNOSIS — E6609 Other obesity due to excess calories: Secondary | ICD-10-CM

## 2023-09-19 DIAGNOSIS — Z23 Encounter for immunization: Secondary | ICD-10-CM

## 2023-09-19 DIAGNOSIS — N898 Other specified noninflammatory disorders of vagina: Secondary | ICD-10-CM | POA: Diagnosis not present

## 2023-09-19 NOTE — Progress Notes (Signed)
Subjective:    Patient ID: Robin Harris, female    DOB: Oct 01, 2001, 22 y.o.   MRN: [id number]  HPI  Patient presents to clinic today for her annual exam.  Flu: 08/2023 Tetanus: 05/2015 COVID: never Dentist: biannually  Diet: She does eat meat. She consumes fruits and veggies. She does eat some fried foods. She drinks mostly water and tea. Exercise: Walking  Review of Systems     Past Medical History:  Diagnosis Date   ALL (acute lymphoblastic leukemia) (HCC) 2007   semiannually sees Dr. Doylene Canning [facility]), in remission ==> rec yearly CBC, CMP, CPE, and echo Q58yrs - and plz fax to [facility] peds onc (see note dated 10/2012)   Arthritis    pt states she does not have arthritis    Current Outpatient Medications  Medication Sig Dispense Refill   amoxicillin-clavulanate (AUGMENTIN) 875-125 MG tablet Take 1 tablet by mouth 2 (two) times daily. 14 tablet 0   clobetasol cream (TEMOVATE) 0.05 % Apply to affected area as directed daily up to 5 days a week to eczema on hands and arms as needed for flares, avoid face, groin, axilla 60 g 2   fluticasone (FLONASE) 50 MCG/ACT nasal spray Place 2 sprays into both nostrils daily. 16 g 0   ibuprofen (ADVIL) 800 MG tablet Take 1 tablet (800 mg total) by mouth 3 (three) times daily. 90 tablet 5   influenza vac split trivalent PF (FLULAVAL) 0.5 ML injection Inject into the muscle. 0.5 mL 0   meloxicam (MOBIC) 7.5 MG tablet Take 1 tablet (7.5 mg total) by mouth daily as needed for pain. 30 tablet 0   Norgestimate-Ethinyl Estradiol Triphasic (TRI-VYLIBRA LO) 0.18/0.215/0.25 MG-25 MCG tab Take 1 tablet by mouth daily. Need office visit for  more refills 28 tablet 2   oxyCODONE-acetaminophen (PERCOCET/ROXICET) 5-325 MG tablet Take 1-2 tablets by mouth every 6 (six) hours as needed for severe pain. 25 tablet 0   promethazine (PHENERGAN) 25 MG tablet Take 1 tablet (25 mg total) by mouth every 8 (eight) hours as needed for nausea or vomiting. 20 tablet 0    triamcinolone cream (KENALOG) 0.1 % Apply 1 application topically 2 (two) times daily. 30 g 0   No current facility-administered medications for this visit.    Allergies  Allergen Reactions   Sulfamethoxazole-Trimethoprim Rash    Family History  Problem Relation Age of Onset   Diabetes Maternal Grandmother    Hyperlipidemia Maternal Grandmother    Coronary artery disease Neg Hx    Stroke Neg Hx     Social History   Socioeconomic History   Marital status: Single    Spouse name: Not on file   Number of children: Not on file   Years of education: Not on file   Highest education level: Not on file  Occupational History   Not on file  Tobacco Use   Smoking status: Never   Smokeless tobacco: Never  Vaping Use   Vaping status: Never Used  Substance and Sexual Activity   Alcohol use: No   Drug use: No   Sexual activity: Never  Other Topics Concern   Not on file  Social History Narrative   Lives with mom and sister [name]).  No pets.   No smokers at home.   Social Determinants of Health   Financial Resource Strain: Not on file  Food Insecurity: Not on file  Transportation Needs: Not on file  Physical Activity: Not on file  Stress: Not on file  Social Connections: Not on file  Intimate Partner Violence: Not on file     Constitutional: Denies fever, malaise, fatigue, headache or abrupt weight changes.  HEENT: Denies eye pain, eye redness, ear pain, ringing in the ears, wax buildup, runny nose, nasal congestion, bloody nose, or sore throat. Respiratory: Denies difficulty breathing, shortness of breath, cough or sputum production.   Cardiovascular: Denies chest pain, chest tightness, palpitations or swelling in the hands or feet.  Gastrointestinal: Denies abdominal pain, bloating, constipation, diarrhea or blood in the stool.  GU: Pt reports vaginal itching. Denies urgency, frequency, pain with urination, burning sensation, blood in urine, odor or  discharge. Musculoskeletal: Denies decrease in range of motion, difficulty with gait, muscle pain or joint pain and swelling.  Skin: Denies redness, rashes, lesions or ulcercations.  Neurological: Denies dizziness, difficulty with memory, difficulty with speech or problems with balance and coordination.  Psych: Pt reports anxiety and depression. Denies SI/HI.  No other specific complaints in a complete review of systems (except as listed in HPI above).  Objective:   Physical Exam  BP 100/72   Pulse 86   Ht 5\' 2"  (1.575 m)   Wt 170 lb 12.8 oz (77.5 kg)   LMP 09/13/2023 (Approximate)   SpO2 98%   BMI 31.24 kg/m    Wt Readings from Last 3 Encounters:  09/16/22 163 lb (73.9 kg) (90%, Z= 1.27)*  07/19/22 166 lb 6.4 oz (75.5 kg) (91%, Z= 1.37)*  02/23/22 167 lb 6.4 oz (75.9 kg) (92%, Z= 1.42)*   * Growth percentiles are based on CDC (Girls, 2-20 Years) data.    General: Appears her stated age, obese, in NAD. Skin: Warm, dry and intact.  HEENT: Head: normal shape and size; Eyes: sclera white, no icterus, conjunctiva pink, PERRLA and EOMs intact;  Neck:  Neck supple, trachea midline. No masses, lumps or thyromegaly present.  Cardiovascular: Normal rate and rhythm. S1,S2 noted.  No murmur, rubs or gallops noted. No JVD or BLE edema.  Pulmonary/Chest: Normal effort and positive vesicular breath sounds. No respiratory distress. No wheezes, rales or ronchi noted.  Abdomen:  Normal bowel sounds.  Musculoskeletal: Strength 5/5 BUE/BLE. No difficulty with gait.  Neurological: Alert and oriented. Cranial nerves II-XII grossly intact. Coordination normal.  Psychiatric: Mood and affect normal. Behavior is normal. Judgment and thought content normal.    BMET    Component Value Date/Time   NA 142 09/16/2022 1107   K 4.3 09/16/2022 1107   CL 110 09/16/2022 1107   CO2 25 09/16/2022 1107   GLUCOSE 93 09/16/2022 1107   BUN 9 09/16/2022 1107   CREATININE 1.05 (H) 09/16/2022 1107   CALCIUM  9.1 09/16/2022 1107    Lipid Panel     Component Value Date/Time   CHOL 208 (H) 09/16/2022 1107   TRIG 87 09/16/2022 1107   HDL 42 (L) 09/16/2022 1107   CHOLHDL 5.0 (H) 09/16/2022 1107   LDLCALC 147 (H) 09/16/2022 1107    CBC    Component Value Date/Time   WBC 4.7 09/16/2022 1107   RBC 4.35 09/16/2022 1107   HGB 14.2 09/16/2022 1107   HCT 41.3 09/16/2022 1107   PLT 204 09/16/2022 1107   MCV 94.9 09/16/2022 1107   MCH 32.6 09/16/2022 1107   MCHC 34.4 09/16/2022 1107   RDW 11.8 09/16/2022 1107   LYMPHSABS 944 (L) 10/27/2021 1120   MONOABS 0.5 11/14/2017 1418   EOSABS 230 10/27/2021 1120   BASOSABS 20 10/27/2021 1120    Hgb A1C  Lab Results  Component Value Date   HGBA1C 5.1 09/16/2022           Assessment & Plan:   Preventative Health Maintenance:  Flu shot UTD Tetanus UTD Encouraged her to get her COVID-vaccine HPV vaccine today Encouraged her to consume a balanced diet and exercise regimen Advised her to see a dentist annually Will check CBC, CMET, TSH, Lipid, A1C today  Vaginal itching:  Will check wet prep for BV and yeast She has no concerns about STD at this time RTC in 1 year, sooner if needed Nicki Reaper, NP

## 2023-09-19 NOTE — Patient Instructions (Signed)

## 2023-09-19 NOTE — Assessment & Plan Note (Signed)
Encouraged diet and exercise for weight loss ?

## 2023-09-20 LAB — CERVICOVAGINAL ANCILLARY ONLY
Bacterial Vaginitis (gardnerella): NEGATIVE
Candida Glabrata: NEGATIVE
Candida Vaginitis: NEGATIVE
Comment: NEGATIVE
Comment: NEGATIVE
Comment: NEGATIVE

## 2023-09-20 LAB — COMPLETE METABOLIC PANEL WITH GFR
AG Ratio: 1.7 (calc) (ref 1.0–2.5)
ALT: 12 U/L (ref 5–32)
AST: 9 U/L — ABNORMAL LOW (ref 12–32)
Albumin: 4.6 g/dL (ref 3.6–5.1)
Alkaline phosphatase (APISO): 60 U/L (ref 36–128)
BUN: 12 mg/dL (ref 7–20)
CO2: 30 mmol/L (ref 20–32)
Calcium: 9.7 mg/dL (ref 8.9–10.4)
Chloride: 104 mmol/L (ref 98–110)
Creat: 0.88 mg/dL (ref 0.50–0.96)
Globulin: 2.7 g/dL (ref 2.0–3.8)
Glucose, Bld: 70 mg/dL (ref 65–99)
Potassium: 4.3 mmol/L (ref 3.8–5.1)
Sodium: 139 mmol/L (ref 135–146)
Total Bilirubin: 0.4 mg/dL (ref 0.2–1.1)
Total Protein: 7.3 g/dL (ref 6.3–8.2)
eGFR: 97 mL/min/{1.73_m2} (ref >=60)

## 2023-09-20 LAB — LIPID PANEL
Cholesterol: 227 mg/dL — ABNORMAL HIGH (ref <170)
HDL: 53 mg/dL (ref >45)
LDL Cholesterol (Calc): 153 mg/dL — ABNORMAL HIGH (ref <110)
Non-HDL Cholesterol (Calc): 174 mg/dL — ABNORMAL HIGH (ref <120)
Total CHOL/HDL Ratio: 4.3 (calc) (ref <5.0)
Triglycerides: 98 mg/dL — ABNORMAL HIGH (ref <90)

## 2023-09-20 LAB — CBC
HCT: 44.3 % (ref 35.0–45.0)
Hemoglobin: 14.6 g/dL (ref 11.7–15.5)
MCH: 31.8 pg (ref 27.0–33.0)
MCHC: 33 g/dL (ref 32.0–36.0)
MCV: 96.5 fL (ref 80.0–100.0)
MPV: 11.7 fL (ref 7.5–12.5)
Platelets: 223 10*3/uL (ref 140–400)
RBC: 4.59 10*6/uL (ref 3.80–5.10)
RDW: 12.2 % (ref 11.0–15.0)
WBC: 5.2 10*3/uL (ref 3.8–10.8)

## 2023-09-20 LAB — HEMOGLOBIN A1C
Hgb A1c MFr Bld: 5.2 %{Hb} (ref <5.7)
Mean Plasma Glucose: 103 mg/dL
eAG (mmol/L): 5.7 mmol/L

## 2023-09-27 ENCOUNTER — Other Ambulatory Visit: Payer: Self-pay

## 2023-09-27 ENCOUNTER — Telehealth: Payer: 59 | Admitting: Family Medicine

## 2023-09-27 ENCOUNTER — Other Ambulatory Visit (HOSPITAL_BASED_OUTPATIENT_CLINIC_OR_DEPARTMENT_OTHER): Payer: Self-pay

## 2023-09-27 ENCOUNTER — Ambulatory Visit: Payer: Self-pay

## 2023-09-27 ENCOUNTER — Telehealth: Payer: 59

## 2023-09-27 ENCOUNTER — Encounter: Payer: Self-pay | Admitting: Internal Medicine

## 2023-09-27 DIAGNOSIS — B9689 Other specified bacterial agents as the cause of diseases classified elsewhere: Secondary | ICD-10-CM

## 2023-09-27 DIAGNOSIS — J019 Acute sinusitis, unspecified: Secondary | ICD-10-CM | POA: Diagnosis not present

## 2023-09-27 MED ORDER — AMOXICILLIN-POT CLAVULANATE 875-125 MG PO TABS
1.0000 | ORAL_TABLET | Freq: Two times a day (BID) | ORAL | 0 refills | Status: DC
  Filled 2023-09-27: qty 14, 7d supply, fill #0

## 2023-09-27 NOTE — Progress Notes (Signed)

## 2023-09-27 NOTE — Telephone Encounter (Signed)
     Chief Complaint: Cough, sore throat,chest tight Symptoms: Above Frequency: 2 days Pertinent Negatives: Patient denies fever Disposition: [] ED /[] Urgent Care (no appt availability in office) / [x] Appointment(In office/virtual)/ []  [facility]/ [] Home Care/ [] Refused Recommended Disposition /[] [facility]/ []  Follow-up with PCP Additional Notes: Agrees with appointment.  Reason for Disposition  SEVERE coughing spells (e.g., whooping sound after coughing, vomiting after coughing)  Answer Assessment - Initial Assessment Questions 1. ONSET: "When did the cough begin?"      2 days ago 2. SEVERITY: "How bad is the cough today?"      Severe 3. SPUTUM: "Describe the color of your sputum" (none, dry cough; clear, white, yellow, green)     None 4. HEMOPTYSIS: "Are you coughing up any blood?" If so ask: "How much?" (flecks, streaks, tablespoons, etc.)     No 5. DIFFICULTY BREATHING: "Are you having difficulty breathing?" If Yes, ask: "How bad is it?" (e.g., mild, moderate, severe)    - MILD: No SOB at rest, mild SOB with walking, speaks normally in sentences, can lie down, no retractions, pulse < 100.    - MODERATE: SOB at rest, SOB with minimal exertion and prefers to sit, cannot lie down flat, speaks in phrases, mild retractions, audible wheezing, pulse 100-120.    - SEVERE: Very SOB at rest, speaks in single words, struggling to breathe, sitting hunched forward, retractions, pulse > 120      No 6. FEVER: "Do you have a fever?" If Yes, ask: "What is your temperature, how was it measured, and when did it start?"     No 7. CARDIAC HISTORY: "Do you have any history of heart disease?" (e.g., heart attack, congestive heart failure)      No 8. LUNG HISTORY: "Do you have any history of lung disease?"  (e.g., pulmonary embolus, asthma, emphysema)     No 9. PE RISK FACTORS: "Do you have a history of blood clots?" (or: recent major surgery, recent prolonged travel,  bedridden)     No 10. OTHER SYMPTOMS: "Do you have any other symptoms?" (e.g., runny nose, wheezing, chest pain)       Chest tight 11. PREGNANCY: "Is there any chance you are pregnant?" "When was your last menstrual period?"       No 12. TRAVEL: "Have you traveled out of the country in the last month?" (e.g., travel history, exposures)       No  Protocols used: Cough - Acute Non-Productive-A-AH

## 2023-09-28 ENCOUNTER — Ambulatory Visit: Payer: 59 | Admitting: Physician Assistant

## 2023-10-01 ENCOUNTER — Ambulatory Visit
Admission: EM | Admit: 2023-10-01 | Discharge: 2023-10-01 | Disposition: A | Discharge status: Home / Self Care | Payer: 59

## 2023-10-01 ENCOUNTER — Ambulatory Visit: Payer: 59

## 2023-10-01 DIAGNOSIS — J40 Bronchitis, not specified as acute or chronic: Secondary | ICD-10-CM | POA: Diagnosis not present

## 2023-10-01 DIAGNOSIS — R0789 Other chest pain: Secondary | ICD-10-CM | POA: Diagnosis not present

## 2023-10-01 DIAGNOSIS — J329 Chronic sinusitis, unspecified: Secondary | ICD-10-CM | POA: Diagnosis not present

## 2023-10-01 DIAGNOSIS — J9801 Acute bronchospasm: Secondary | ICD-10-CM

## 2023-10-01 MED ORDER — ALBUTEROL SULFATE HFA 108 (90 BASE) MCG/ACT IN AERS: 1.0000 | INHALATION_SPRAY | Freq: Four times a day (QID) | RESPIRATORY_TRACT | 0 refills | Status: DC | PRN

## 2023-10-01 MED ORDER — METHYLPREDNISOLONE 4 MG PO TBPK
ORAL_TABLET | ORAL | 0 refills | Status: DC
Start: 2023-10-01 — End: 2023-10-04

## 2023-10-01 MED ORDER — ALBUTEROL SULFATE (2.5 MG/3ML) 0.083% IN NEBU
2.5000 mg | INHALATION_SOLUTION | Freq: Once | RESPIRATORY_TRACT | Status: AC
  Administered 2023-10-01: 2.5 mg via RESPIRATORY_TRACT

## 2023-10-01 MED ORDER — METHYLPREDNISOLONE ACETATE 80 MG/ML IJ SUSP
60.0000 mg | Freq: Once | INTRAMUSCULAR | Status: AC
  Administered 2023-10-01: 60 mg via INTRAMUSCULAR

## 2023-10-01 NOTE — Discharge Instructions (Signed)
I am glad that you are feeling better after the medication in clinic.  Use albuterol every 4-6 hours at home.  Start Medrol Dosepak as prescribed.  This is similar to prednisone it can upset your stomach so make sure to take it with food.  Do not take any NSAIDs with this medication including aspirin, ibuprofen/Advil, naproxen/Aleve.  Continue the Augmentin that was prescribed by the other provider.  Use over-the-counter medications including Mucinex, Flonase, Tylenol.  If your symptoms are not improving within a few days or if anything worsens you have increasing cough, fever, chest pain, shortness of breath, nausea/vomiting you need to be seen immediately.

## 2023-10-01 NOTE — ED Triage Notes (Signed)
Started about a week ago with tight chest then cough, on Monday tried to get visit with PCP (did E Visit) and was given oral antibiotics. Still have the Cough, more productive, Sob at times and chest sore with Cough. "Sore throat often when I cough and after'. Fever in beginning, none since known.

## 2023-10-01 NOTE — ED Provider Notes (Signed)
Renaldo Fiddler    CSN: [id number] Arrival date & time: 10/01/23  0907      History   Chief Complaint Chief Complaint  Patient presents with   Cough   Sore Throat   Headache    HPI Robin Harris is a 22 y.o. female.   Patient presents today accompanied by her mother.  Reports a week plus long history of URI symptoms including congestion, cough, chest tightness, wheezing, intermittent shortness of breath.  She denies any chest pain, nausea, vomiting, fever.  She has been using over-the-counter medications including cold and flu medicine.  She completed an e-visit on 09/27/2023 where she was prescribed Augmentin.  She has been taking this medication as prescribed without improvement of symptoms.  She denies additional antibiotics in the past 90 days.  She denies history of allergies, asthma, COPD.  Denies any known sick contacts.  She is having difficulty with her daily activities including sleeping at night as a result of the symptoms.  She has a remote history of ALL but is in remission and is not currently receiving any treatment.  She is no concern for pregnancy.    Past Medical History:  Diagnosis Date   ALL (acute lymphoblastic leukemia) (HCC) 2007   semiannually sees Dr. Doylene Canning [facility]), in remission ==> rec yearly CBC, CMP, CPE, and echo Q76yrs - and plz fax to [facility] peds onc (see note dated 10/2012)   Arthritis    pt states she does not have arthritis    Patient Active Problem List   Diagnosis Date Noted   Pure hypercholesterolemia 09/19/2023   Class 1 obesity due to excess calories with body mass index (BMI) of 31.0 to 31.9 in adult 09/16/2022   History of acute lymphoblastic leukemia (ALL) 10/27/2021    Past Surgical History:  Procedure Laterality Date   MYRINGOTOMY  2005   Bilateral   PORT-A-CATH REMOVAL  2010   PORTACATH PLACEMENT  2007   for chemo   TUMOR REMOVAL  2008   benign; behind left ear    OB History   No obstetric history on file.       Home Medications    Prior to Admission medications   Medication Sig Start Date End Date Taking? Authorizing Provider  albuterol (VENTOLIN HFA) 108 (90 Base) MCG/ACT inhaler Inhale 1-2 puffs into the lungs every 6 (six) hours as needed for wheezing or shortness of breath. 10/01/23  Yes Brain Honeycutt K, PA-C  amoxicillin-clavulanate (AUGMENTIN) 875-125 MG tablet Take 1 tablet by mouth 2 (two) times daily for 7 days. 09/27/23 10/04/23 Yes Freddy Finner, NP  methylPREDNISolone (MEDROL DOSEPAK) 4 MG TBPK tablet As directed 10/01/23  Yes Lynnex Fulp K, PA-C  Norgestimate-Ethinyl Estradiol Triphasic (TRI-VYLIBRA LO) 0.18/0.215/0.25 MG-25 MCG tab Take 1 tablet by mouth daily. Need office visit for  more refills 06/17/23  Yes Baity, Salvadore Oxford, NP  UNKNOWN TO PATIENT OTC Multi-Symptom Cold.   Yes [provider]  clobetasol cream (TEMOVATE) 0.05 % Apply to affected area as directed daily up to 5 days a week to eczema on hands and arms as needed for flares, avoid face, groin, axilla 08/11/22   Deirdre Evener, MD  ibuprofen (ADVIL) 800 MG tablet Take 1 tablet (800 mg total) by mouth 3 (three) times daily. 11/22/22   Vivi Barrack, DPM  FLUoxetine (PROZAC) 10 MG capsule Take 1 capsule (10 mg total) by mouth daily. 06/11/20 01/23/21  Lorre Munroe, NP    Family History Family  History  Problem Relation Age of Onset   Diabetes Maternal Grandmother    Hyperlipidemia Maternal Grandmother    Coronary artery disease Neg Hx    Stroke Neg Hx     Social History Social History   Tobacco Use   Smoking status: Never    Passive exposure: Never   Smokeless tobacco: Never  Vaping Use   Vaping status: Never Used  Substance Use Topics   Alcohol use: No   Drug use: Never     Allergies   Prednisone and Sulfamethoxazole-trimethoprim   Review of Systems Review of Systems  Constitutional:  Positive for activity change. Negative for appetite change, fatigue and fever.  HENT:  Positive  for congestion. Negative for sinus pressure, sneezing and sore throat.   Respiratory:  Positive for cough, chest tightness and shortness of breath. Negative for wheezing.   Cardiovascular:  Negative for chest pain.  Gastrointestinal:  Negative for abdominal pain, diarrhea, nausea and vomiting.     Physical Exam Triage Vital Signs ED Triage Vitals  Encounter Vitals Group     BP 10/01/23 0927 110/74     Systolic BP Percentile --      Diastolic BP Percentile --      Pulse Rate 10/01/23 0927 91     Resp 10/01/23 0927 18     Temp 10/01/23 0927 99.1 F (37.3 C)     Temp Source 10/01/23 0927 Oral     SpO2 10/01/23 0927 99 %     Weight 10/01/23 0924 165 lb (74.8 kg)     Height 10/01/23 0924 5\' 2"  (1.575 m)     Head Circumference --      Peak Flow --      Pain Score 10/01/23 0920 7     Pain Loc --      Pain Education --      Exclude from Growth Chart --    No data found.  Updated Vital Signs BP 110/74 (BP Location: Left Arm)   Pulse 95   Temp 99.1 F (37.3 C) (Oral)   Resp 18   Ht 5\' 2"  (1.575 m)   Wt 165 lb (74.8 kg)   LMP 09/13/2023 (Approximate)   SpO2 98%   BMI 30.18 kg/m   Visual Acuity Right Eye Distance:   Left Eye Distance:   Bilateral Distance:    Right Eye Near:   Left Eye Near:    Bilateral Near:     Physical Exam Vitals reviewed.  Constitutional:      General: She is awake. She is not in acute distress.    Appearance: Normal appearance. She is well-developed. She is not ill-appearing.     Comments: Very pleasant female appears stated age in no acute distress sitting comfortably in exam room  HENT:     Head: Normocephalic and atraumatic.     Right Ear: Tympanic membrane, ear canal and external ear normal. Tympanic membrane is not erythematous or bulging.     Left Ear: Tympanic membrane, ear canal and external ear normal. Tympanic membrane is not erythematous or bulging.     Nose:     Right Sinus: No maxillary sinus tenderness or frontal sinus  tenderness.     Left Sinus: No maxillary sinus tenderness or frontal sinus tenderness.     Mouth/Throat:     Pharynx: Uvula midline. Postnasal drip present. No oropharyngeal exudate or posterior oropharyngeal erythema.  Cardiovascular:     Rate and Rhythm: Normal rate and regular rhythm.  Heart sounds: Normal heart sounds, S1 normal and S2 normal. No murmur heard. Pulmonary:     Effort: Pulmonary effort is normal.     Breath sounds: Wheezing present. No rhonchi or rales.     Comments: Scattered wheezing with reactive cough. Lymphadenopathy:     Head:     Right side of head: No submental, submandibular or tonsillar adenopathy.     Left side of head: No submental, submandibular or tonsillar adenopathy.     Cervical: No cervical adenopathy.  Psychiatric:        Behavior: Behavior is cooperative.      UC Treatments / Results  Labs (all labs ordered are listed, but only abnormal results are displayed) Labs Reviewed - No data to display  EKG   Radiology No results found.  Procedures Procedures (including critical care time)  Medications Ordered in UC Medications  albuterol (PROVENTIL) (2.5 MG/3ML) 0.083% nebulizer solution 2.5 mg (2.5 mg Nebulization Given 10/01/23 1004)  methylPREDNISolone acetate (DEPO-MEDROL) injection 60 mg (60 mg Intramuscular Given 10/01/23 1005)    Initial Impression / Assessment and Plan / UC Course  I have reviewed the triage vital signs and the nursing notes.  Pertinent labs & imaging results that were available during my care of the patient were reviewed by me and considered in my medical decision making (see chart for details).     Patient is well-appearing, afebrile, nontoxic, nontachycardic.  No indication for viral testing as she has been symptomatic for over a week and this would not change management.  She did have wheezing on exam and was given albuterol and Depo-Medrol injection in clinic with significant improvement of symptoms and  resolution of adventitious lung sounds.  Chest x-ray was deferred as adventitious lung sounds resolved with medication and oxygen saturation was 98%.  She was encouraged to complete course of Augmentin that was prescribed by the other provider.  Will also add albuterol and methylprednisolone.  She has had GI upset with prednisone in the past and we discussed that this could occur with any corticosteroid.  It is important that she eats with methylprednisolone and we discussed that if she has any concerning symptoms she should stop the medication to be seen immediately.  She is to avoid NSAIDs while on methylprednisolone.  Recommended that she use over-the-counter medications as Mucinex, Flonase, Tylenol.  She is to rest and drink plenty of fluid.  If her symptoms are not improving within a few days she is to return for reevaluation.  Discussed that if anything worsens she needs to be seen emergently including worsening cough, chest pain, shortness of breath, fever.  Should return precautions given.  Excuse note provided.  Final Clinical Impressions(s) / UC Diagnoses   Final diagnoses:  Sinobronchitis  Bronchospasm  Chest tightness     Discharge Instructions      I am glad that you are feeling better after the medication in clinic.  Use albuterol every 4-6 hours at home.  Start Medrol Dosepak as prescribed.  This is similar to prednisone it can upset your stomach so make sure to take it with food.  Do not take any NSAIDs with this medication including aspirin, ibuprofen/Advil, naproxen/Aleve.  Continue the Augmentin that was prescribed by the other provider.  Use over-the-counter medications including Mucinex, Flonase, Tylenol.  If your symptoms are not improving within a few days or if anything worsens you have increasing cough, fever, chest pain, shortness of breath, nausea/vomiting you need to be seen immediately.  ED Prescriptions     Medication Sig Dispense Auth. Provider   albuterol  (VENTOLIN HFA) 108 (90 Base) MCG/ACT inhaler Inhale 1-2 puffs into the lungs every 6 (six) hours as needed for wheezing or shortness of breath. 18 g Aahil Fredin K, PA-C   methylPREDNISolone (MEDROL DOSEPAK) 4 MG TBPK tablet As directed 21 tablet Owin Vignola K, PA-C      PDMP not reviewed this encounter.   Jeani Hawking, PA-C 10/01/23 1026

## 2023-10-02 ENCOUNTER — Other Ambulatory Visit: Payer: Self-pay

## 2023-10-02 ENCOUNTER — Emergency Department (HOSPITAL_BASED_OUTPATIENT_CLINIC_OR_DEPARTMENT_OTHER): Payer: 59 | Admitting: Radiology

## 2023-10-02 ENCOUNTER — Emergency Department (HOSPITAL_BASED_OUTPATIENT_CLINIC_OR_DEPARTMENT_OTHER)
Admission: EM | Admit: 2023-10-02 | Discharge: 2023-10-03 | Disposition: A | Discharge status: Home / Self Care | Payer: 59

## 2023-10-02 ENCOUNTER — Encounter: Payer: Self-pay | Admitting: Internal Medicine

## 2023-10-02 DIAGNOSIS — R918 Other nonspecific abnormal finding of lung field: Secondary | ICD-10-CM | POA: Diagnosis not present

## 2023-10-02 DIAGNOSIS — R000 Tachycardia, unspecified: Secondary | ICD-10-CM | POA: Insufficient documentation

## 2023-10-02 DIAGNOSIS — J181 Lobar pneumonia, unspecified organism: Secondary | ICD-10-CM | POA: Insufficient documentation

## 2023-10-02 DIAGNOSIS — Z20822 Contact with and (suspected) exposure to covid-19: Secondary | ICD-10-CM | POA: Insufficient documentation

## 2023-10-02 DIAGNOSIS — R0602 Shortness of breath: Secondary | ICD-10-CM | POA: Diagnosis not present

## 2023-10-02 DIAGNOSIS — R5381 Other malaise: Secondary | ICD-10-CM | POA: Diagnosis not present

## 2023-10-02 DIAGNOSIS — R509 Fever, unspecified: Secondary | ICD-10-CM | POA: Diagnosis not present

## 2023-10-02 DIAGNOSIS — J168 Pneumonia due to other specified infectious organisms: Secondary | ICD-10-CM | POA: Diagnosis not present

## 2023-10-02 DIAGNOSIS — J189 Pneumonia, unspecified organism: Secondary | ICD-10-CM | POA: Diagnosis present

## 2023-10-02 LAB — CBC WITH DIFFERENTIAL/PLATELET
Abs Immature Granulocytes: 0.05 10*3/uL (ref 0.00–0.07)
Basophils Absolute: 0.1 10*3/uL (ref 0.0–0.1)
Basophils Relative: 0 %
Eosinophils Absolute: 0.3 10*3/uL (ref 0.0–0.5)
Eosinophils Relative: 2 %
HCT: 39.1 % (ref 36.0–46.0)
Hemoglobin: 13.5 g/dL (ref 12.0–15.0)
Immature Granulocytes: 0 %
Lymphocytes Relative: 6 %
Lymphs Abs: 0.9 10*3/uL (ref 0.7–4.0)
MCH: 32.1 pg (ref 26.0–34.0)
MCHC: 34.5 g/dL (ref 30.0–36.0)
MCV: 93.1 fL (ref 80.0–100.0)
Monocytes Absolute: 1 10*3/uL (ref 0.1–1.0)
Monocytes Relative: 6 %
Neutro Abs: 13.1 10*3/uL — ABNORMAL HIGH (ref 1.7–7.7)
Neutrophils Relative %: 86 %
Platelets: 188 10*3/uL (ref 150–400)
RBC: 4.2 MIL/uL (ref 3.87–5.11)
RDW: 11.9 % (ref 11.5–15.5)
WBC: 15.3 10*3/uL — ABNORMAL HIGH (ref 4.0–10.5)
nRBC: 0 % (ref 0.0–0.2)

## 2023-10-02 LAB — URINALYSIS, W/ REFLEX TO CULTURE (INFECTION SUSPECTED)
Bilirubin Urine: NEGATIVE
Glucose, UA: NEGATIVE mg/dL
Hgb urine dipstick: NEGATIVE
Ketones, ur: 15 mg/dL — AB
Leukocytes,Ua: NEGATIVE
Nitrite: NEGATIVE
Specific Gravity, Urine: 1.025 (ref 1.005–1.030)
pH: 6 (ref 5.0–8.0)

## 2023-10-02 LAB — COMPREHENSIVE METABOLIC PANEL
ALT: 6 U/L (ref 0–44)
AST: 11 U/L — ABNORMAL LOW (ref 15–41)
Albumin: 4.2 g/dL (ref 3.5–5.0)
Alkaline Phosphatase: 50 U/L (ref 38–126)
Anion gap: 12 (ref 5–15)
BUN: 13 mg/dL (ref 6–20)
CO2: 21 mmol/L — ABNORMAL LOW (ref 22–32)
Calcium: 9.5 mg/dL (ref 8.9–10.3)
Chloride: 103 mmol/L (ref 98–111)
Creatinine, Ser: 0.83 mg/dL (ref 0.44–1.00)
GFR, Estimated: >60 mL/min (ref >60)
Glucose, Bld: 89 mg/dL (ref 70–99)
Potassium: 3.6 mmol/L (ref 3.5–5.1)
Sodium: 136 mmol/L (ref 135–145)
Total Bilirubin: 0.5 mg/dL (ref <1.2)
Total Protein: 7.8 g/dL (ref 6.5–8.1)

## 2023-10-02 LAB — RESP PANEL BY RT-PCR (RSV, FLU A&B, COVID)  RVPGX2
Influenza A by PCR: NEGATIVE
Influenza B by PCR: NEGATIVE
Resp Syncytial Virus by PCR: NEGATIVE
SARS Coronavirus 2 by RT PCR: NEGATIVE

## 2023-10-02 LAB — APTT: aPTT: 27 s (ref 24–36)

## 2023-10-02 LAB — PROTIME-INR
INR: 1 (ref 0.8–1.2)
Prothrombin Time: 13.5 s (ref 11.4–15.2)

## 2023-10-02 LAB — PREGNANCY, URINE: Preg Test, Ur: NEGATIVE

## 2023-10-02 LAB — LACTIC ACID, PLASMA: Lactic Acid, Venous: 0.7 mmol/L (ref 0.5–1.9)

## 2023-10-02 MED ORDER — ALBUTEROL SULFATE HFA 108 (90 BASE) MCG/ACT IN AERS: 2.0000 | INHALATION_SPRAY | RESPIRATORY_TRACT | Status: DC | PRN

## 2023-10-02 MED ORDER — BENZONATATE 100 MG PO CAPS
100.0000 mg | ORAL_CAPSULE | Freq: Once | ORAL | Status: AC
  Administered 2023-10-02: 100 mg via ORAL
  Filled 2023-10-02: qty 1

## 2023-10-02 MED ORDER — SODIUM CHLORIDE 0.9 % IV SOLN
1.0000 g | Freq: Once | INTRAVENOUS | Status: AC
  Administered 2023-10-02: 1 g via INTRAVENOUS
  Filled 2023-10-02: qty 10

## 2023-10-02 MED ORDER — SODIUM CHLORIDE 0.9 % IV SOLN
500.0000 mg | Freq: Once | INTRAVENOUS | Status: AC
  Administered 2023-10-02: 500 mg via INTRAVENOUS
  Filled 2023-10-02: qty 5

## 2023-10-02 MED ORDER — SODIUM CHLORIDE 0.9 % IV BOLUS
1000.0000 mL | Freq: Once | INTRAVENOUS | Status: AC
  Administered 2023-10-02: 1000 mL via INTRAVENOUS

## 2023-10-02 MED ORDER — ALBUTEROL SULFATE (2.5 MG/3ML) 0.083% IN NEBU
2.5000 mg | INHALATION_SOLUTION | RESPIRATORY_TRACT | Status: DC | PRN
  Administered 2023-10-02 – 2023-10-03 (×2): 2.5 mg via RESPIRATORY_TRACT
  Filled 2023-10-02 (×2): qty 3

## 2023-10-02 MED ORDER — ONDANSETRON HCL 4 MG/2ML IJ SOLN
4.0000 mg | Freq: Once | INTRAMUSCULAR | Status: AC
  Administered 2023-10-02: 4 mg via INTRAVENOUS
  Filled 2023-10-02: qty 2

## 2023-10-02 NOTE — Progress Notes (Signed)
Hospitalist Transfer Note:    Nursing staff, Please call [facility] Admits & Consults System-Wide number on Amion [phone]) as soon as patient's arrival, so appropriate admitting provider can evaluate the pt.  Transferring facility: [facility] Requesting provider: Dr. Maple Hudson (EDP at [facility]) Reason for transfer: admission for further evaluation and management of sepsis due to community-acquired pneumonia.     22 year old female who presented to [facility] ED complaining of 2-3 days of cough, congestion, subjective fever, as well as recurrent nausea resulting in multiple episodes of nonbloody, nonbilious emesis, and significantly limiting the patient's ability to tolerate p.o. at this time.  She presented with these complaints to urgent care yesterday (11/30), and was diagnosed with suspected acute viral bronchitis.  However, in the setting of interval persistence and the above symptoms, she presents to [facility] this evening for further evaluation management thereof.  Vital signs in the ED were notable for the following: Temperature max 99.5; heart rates in the low 100s to 120s; systolic blood pressures in the low 100s to 130s; respiratory rate 19-20, and oxygen saturation in the mid to high 90s on room air.  Labs were notable for CBC demonstrated white cell count of 15,300, with 86% neutrophils.  Blood cultures x 2 were collected prior to initiation of IV antibiotics.  Lactic acid nonelevated at 0.7.  Imaging notable for suggestion of right upper lobe infiltrate, concerning for pneumonia.  Medications administered prior to transfer included the following: Azithromycin, Rocephin.   Subsequently, I accepted this patient for transfer for inpatient admission to a med/tele bed at [facility] or [facility]  (first available) for further work-up and management of the above.      Newton Pigg, DO Hospitalist

## 2023-10-02 NOTE — ED Provider Notes (Signed)
[place] EMERGENCY DEPARTMENT AT [facility] Provider Note   CSN: [id number] Arrival date & time: 10/02/23  1921     History  Chief Complaint  Patient presents with   Cough   Shortness of Breath    Robin Harris is a 22 y.o. female.  This is a 22 year old female presenting emergency department for fevers, cough, shortness of breath.  Reports congestion rhinorrhea generalized malaise headache over the past week.  Went to urgent care yesterday and was told she has bronchitis given steroids and discharged.  She reports no improvement continues to have fevers and shortness of breath.   Cough Associated symptoms: shortness of breath   Shortness of Breath Associated symptoms: cough        Home Medications Prior to Admission medications   Medication Sig Start Date End Date Taking? Authorizing Provider  albuterol (VENTOLIN HFA) 108 (90 Base) MCG/ACT inhaler Inhale 1-2 puffs into the lungs every 6 (six) hours as needed for wheezing or shortness of breath. 10/01/23   Raspet, Noberto Retort, PA-C  amoxicillin-clavulanate (AUGMENTIN) 875-125 MG tablet Take 1 tablet by mouth 2 (two) times daily for 7 days. 09/27/23 10/04/23  Freddy Finner, NP  clobetasol cream (TEMOVATE) 0.05 % Apply to affected area as directed daily up to 5 days a week to eczema on hands and arms as needed for flares, avoid face, groin, axilla 08/11/22   Deirdre Evener, MD  ibuprofen (ADVIL) 800 MG tablet Take 1 tablet (800 mg total) by mouth 3 (three) times daily. 11/22/22   Vivi Barrack, DPM  methylPREDNISolone (MEDROL DOSEPAK) 4 MG TBPK tablet As directed 10/01/23   Raspet, Erin K, PA-C  Norgestimate-Ethinyl Estradiol Triphasic (TRI-VYLIBRA LO) 0.18/0.215/0.25 MG-25 MCG tab Take 1 tablet by mouth daily. Need office visit for  more refills 06/17/23   Lorre Munroe, NP  UNKNOWN TO PATIENT OTC Multi-Symptom Cold.    [provider]  FLUoxetine (PROZAC) 10 MG capsule Take 1 capsule (10 mg total) by  mouth daily. 06/11/20 01/23/21  Lorre Munroe, NP      Allergies    Prednisone and Sulfamethoxazole-trimethoprim    Review of Systems   Review of Systems  Respiratory:  Positive for cough and shortness of breath.     Physical Exam Updated Vital Signs BP 122/71 (BP Location: Right Arm)   Pulse (!) 109   Temp 99 F (37.2 C) (Axillary)   Resp 19   Ht 5\' 2"  (1.575 m)   Wt 74.8 kg   LMP 09/13/2023 (Approximate)   SpO2 98%   BMI 30.18 kg/m  Physical Exam Vitals reviewed.  Constitutional:      General: She is not in acute distress.    Appearance: She is not toxic-appearing.  HENT:     Head: Normocephalic.  Cardiovascular:     Rate and Rhythm: Regular rhythm. Tachycardia present.  Pulmonary:     Effort: Accessory muscle usage present.     Breath sounds: Rhonchi present. No wheezing.  Musculoskeletal:     Right lower leg: No edema.     Left lower leg: No edema.  Skin:    General: Skin is warm.  Neurological:     Mental Status: She is alert.  Psychiatric:        Mood and Affect: Mood normal.        Behavior: Behavior normal.     ED Results / Procedures / Treatments   Labs (all labs ordered are listed, but only abnormal results are  displayed) Labs Reviewed  COMPREHENSIVE METABOLIC PANEL - Abnormal; Notable for the following components:      Result Value   CO2 21 (*)    AST 11 (*)    All other components within normal limits  CBC WITH DIFFERENTIAL/PLATELET - Abnormal; Notable for the following components:   WBC 15.3 (*)    Neutro Abs 13.1 (*)    All other components within normal limits  URINALYSIS, W/ REFLEX TO CULTURE (INFECTION SUSPECTED) - Abnormal; Notable for the following components:   APPearance HAZY (*)    Ketones, ur 15 (*)    Protein, ur TRACE (*)    Bacteria, UA MANY (*)    All other components within normal limits  RESP PANEL BY RT-PCR (RSV, FLU A&B, COVID)  RVPGX2  CULTURE, BLOOD (ROUTINE X 2)  CULTURE, BLOOD (ROUTINE X 2)  LACTIC ACID, PLASMA   PROTIME-INR  APTT  PREGNANCY, URINE  LACTIC ACID, PLASMA    EKG None  Radiology DG Chest 2 View  Result Date: 10/02/2023 CLINICAL DATA:  Shortness of breath, fever. EXAM: CHEST - 2 VIEW COMPARISON:  November 25, 2018. FINDINGS: The heart size and mediastinal contours are within normal limits. Right upper lobe airspace opacity is noted consistent with pneumonia. Left lung is clear. The visualized skeletal structures are unremarkable. IMPRESSION: Right upper lobe airspace opacity consistent with pneumonia. Electronically Signed   By: Lupita Raider M.D.   On: 10/02/2023 19:51    Procedures Procedures    Medications Ordered in ED Medications  albuterol (PROVENTIL) (2.5 MG/3ML) 0.083% nebulizer solution 2.5 mg (2.5 mg Nebulization Given 10/02/23 2316)  sodium chloride 0.9 % bolus 1,000 mL (1,000 mLs Intravenous New Bag/Given 10/02/23 2142)  ondansetron (ZOFRAN) injection 4 mg (4 mg Intravenous Given 10/02/23 2147)  cefTRIAXone (ROCEPHIN) 1 g in sodium chloride 0.9 % 100 mL IVPB (0 g Intravenous Stopped 10/02/23 2248)  azithromycin (ZITHROMAX) 500 mg in sodium chloride 0.9 % 250 mL IVPB (500 mg Intravenous New Bag/Given 10/02/23 2252)  benzonatate (TESSALON) capsule 100 mg (100 mg Oral Given 10/02/23 2259)  sodium chloride 0.9 % bolus 1,000 mL (0 mLs Intravenous Stopped 10/02/23 2307)    ED Course/ Medical Decision Making/ A&P Clinical Course as of 10/02/23 2359  Sun Oct 02, 2023  2045 DG Chest 2 View IMPRESSION: Right upper lobe airspace opacity consistent with pneumonia.   [TY]    Clinical Course User Index [TY] Coral Spikes, DO                                 Medical Decision Making This is a 22 year old female presenting emergency department with URI symptoms and shortness of breath.  Reported fevers at home, she is quite tachycardic here.  Maintaining oxygen saturation on room air; does not appear to be in overt respiratory distress.  Given reported fevers at home sepsis  type workup pursued.  She does have a leukocytosis 15.3.  However lactate not significantly elevated.  Chest x-ray with right upper lobe pneumonia. Started on ABX here; patient's heart rate remains persistently elevated.  Was still having some nausea here despite antiemetics.  Her other vital signs reassuring.  No significant metabolic derangements.  UA without overt UTI.  Given patient's persistent tachycardia and my concern for her ability to tolerate p.o. Will admit for further IV antibiotics for her pneumonia.  Amount and/or Complexity of Data Reviewed Independent Historian:     Details:  Grandmother notes decreased p.o. intake over the past week External Data Reviewed:     Details: Per chart review was seen yesterday in urgent care given breathing treatment, Solu-Medrol and discharged.  Also appears that she has a history of a LL Labs: ordered. Decision-making details documented in ED Course. Radiology: ordered. Decision-making details documented in ED Course.  Risk Prescription drug management. Decision regarding hospitalization.         Final Clinical Impression(s) / ED Diagnoses Final diagnoses:  Pneumonia of right upper lobe due to infectious organism    Rx / DC Orders ED Discharge Orders     None         Coral Spikes, DO 10/02/23 2359

## 2023-10-02 NOTE — ED Triage Notes (Signed)
Pt POV reporting SOB, fever, n/v, URI sx past week. Seen by UC yesterday, given steroids, abx, and breathing treatment, told to come to ED if sx persist.

## 2023-10-03 ENCOUNTER — Other Ambulatory Visit (HOSPITAL_COMMUNITY): Payer: Self-pay

## 2023-10-03 ENCOUNTER — Other Ambulatory Visit (HOSPITAL_BASED_OUTPATIENT_CLINIC_OR_DEPARTMENT_OTHER): Payer: Self-pay

## 2023-10-03 ENCOUNTER — Encounter (HOSPITAL_BASED_OUTPATIENT_CLINIC_OR_DEPARTMENT_OTHER): Payer: Self-pay | Admitting: Internal Medicine

## 2023-10-03 ENCOUNTER — Other Ambulatory Visit: Payer: Self-pay

## 2023-10-03 MED ORDER — AZITHROMYCIN 250 MG PO TABS
250.0000 mg | ORAL_TABLET | Freq: Every day | ORAL | 0 refills | Status: DC
  Filled 2023-10-03: qty 6, 5d supply, fill #0

## 2023-10-03 MED ORDER — CEFPODOXIME PROXETIL 200 MG PO TABS
200.0000 mg | ORAL_TABLET | Freq: Two times a day (BID) | ORAL | 0 refills | Status: DC
  Filled 2023-10-03: qty 14, 7d supply, fill #0

## 2023-10-03 MED ORDER — ONDANSETRON 4 MG PO TBDP
4.0000 mg | ORAL_TABLET | Freq: Three times a day (TID) | ORAL | 0 refills | Status: DC | PRN
  Filled 2023-10-03: qty 20, 7d supply, fill #0

## 2023-10-03 MED ORDER — FLUCONAZOLE 150 MG PO TABS
150.0000 mg | ORAL_TABLET | Freq: Every day | ORAL | 0 refills | Status: DC
  Filled 2023-10-03: qty 1, 1d supply, fill #0

## 2023-10-03 MED ORDER — AEROCHAMBER PLUS FLO-VU SMALL MISC
1.0000 | Freq: Once | Status: AC
  Administered 2023-10-03: 1
  Filled 2023-10-03: qty 1

## 2023-10-03 MED ORDER — ALBUTEROL SULFATE HFA 108 (90 BASE) MCG/ACT IN AERS
2.0000 | INHALATION_SPRAY | Freq: Once | RESPIRATORY_TRACT | Status: AC
  Administered 2023-10-03: 2 via RESPIRATORY_TRACT
  Filled 2023-10-03: qty 6.7

## 2023-10-03 MED ORDER — IPRATROPIUM-ALBUTEROL 0.5-2.5 (3) MG/3ML IN SOLN
3.0000 mL | RESPIRATORY_TRACT | 0 refills | Status: AC | PRN
  Filled 2023-10-03 (×2): qty 360, 20d supply, fill #0

## 2023-10-03 NOTE — Discharge Instructions (Addendum)
You have been given a prescription for antibiotics and nausea medications.  You may take the first dose around dinner time tonight as you received IV antibiotics last night.  Continue to stay hydrated. If you begin to feel worse, please return to the ED for continued care.

## 2023-10-03 NOTE — ED Notes (Signed)
Patient denies pain and is resting comfortably.  

## 2023-10-03 NOTE — ED Notes (Signed)
[place] w/ cl to cancel bed

## 2023-10-03 NOTE — ED Provider Notes (Signed)
  Physical Exam  BP 113/75   Pulse (!) 101   Temp 98.9 F (37.2 C) (Axillary)   Resp 16   Ht 5\' 2"  (1.575 m)   Wt 74.8 kg   LMP 09/13/2023 (Approximate)   SpO2 98%   BMI 30.18 kg/m   Physical Exam  Procedures  Procedures  ED Course / MDM   Clinical Course as of 10/03/23 0803  Sun Oct 02, 2023  2045 DG Chest 2 View IMPRESSION: Right upper lobe airspace opacity consistent with pneumonia.   [TY]    Clinical Course User Index [TY] Coral Spikes, DO    Received care of patient from previous provider.  Please see their notes for prior history, physical and care.  Briefly this is-year-old female who presented with concern for cough, shortness of breath, as well as nausea and vomiting.  She was found to have a right upper lobe pneumonia, tachycardia, leukocytosis, and due to nausea, vomiting and tachycardia was awaiting inpatient admission.  She received IV antibiotics and hydration last night and is feeling significantly improved this morning.  Her heart rate has been down to the 90s, she is not tachypneic, has had no hypoxia during her emergency department stay.  She has ambulated to the bathroom and back without shortness of breath.  She is now tolerating p.o.  Given her improvement following the therapies she was given last night and observation overnight with continued improvement, I do feel she is appropriate for outpatient treatment at this time.  She is appropriate for outpatient treatment also with curb 65/PSI/PORT scores.  Discussed at this point feel discharge with strict return precautions is appropriate.  She was given rx for cefpodoxime, azithromycin.  She reports improvement with albuterol although does not have wheezing or tightness on exam--given spacer to use with albuterol.  Patient discharged in stable condition with understanding of reasons to return.       Alvira Monday, MD 10/03/23 [phone]

## 2023-10-03 NOTE — ED Notes (Signed)
Ice Cream given. No complaints. Mom at bedside.

## 2023-10-03 NOTE — ED Notes (Signed)
Family updated as to patient's status.

## 2023-10-03 NOTE — ED Notes (Signed)
RT Note: Patient was educated on the use of an Albuterol inhaler with a spacer. Patient tolerated well

## 2023-10-04 ENCOUNTER — Other Ambulatory Visit: Payer: Self-pay

## 2023-10-04 ENCOUNTER — Ambulatory Visit (INDEPENDENT_AMBULATORY_CARE_PROVIDER_SITE_OTHER): Payer: 59 | Admitting: Internal Medicine

## 2023-10-04 ENCOUNTER — Encounter: Payer: Self-pay | Admitting: Internal Medicine

## 2023-10-04 VITALS — BP 110/72 | HR 99 | Resp 16 | Wt 166.8 lb

## 2023-10-04 DIAGNOSIS — J189 Pneumonia, unspecified organism: Secondary | ICD-10-CM | POA: Diagnosis not present

## 2023-10-04 LAB — CBC
HCT: 40.6 % (ref 35.0–45.0)
Hemoglobin: 13.2 g/dL (ref 11.7–15.5)
MCH: 30.8 pg (ref 27.0–33.0)
MCHC: 32.5 g/dL (ref 32.0–36.0)
MCV: 94.9 fL (ref 80.0–100.0)
MPV: 12.2 fL (ref 7.5–12.5)
Platelets: 223 10*3/uL (ref 140–400)
RBC: 4.28 10*6/uL (ref 3.80–5.10)
RDW: 11.8 % (ref 11.0–15.0)
WBC: 6.2 10*3/uL (ref 3.8–10.8)

## 2023-10-04 MED ORDER — ALBUTEROL SULFATE HFA 108 (90 BASE) MCG/ACT IN AERS
1.0000 | INHALATION_SPRAY | Freq: Four times a day (QID) | RESPIRATORY_TRACT | 0 refills | Status: DC | PRN
  Filled 2023-10-04: qty 6.7, 25d supply, fill #0

## 2023-10-04 MED ORDER — ONDANSETRON 4 MG PO TBDP
4.0000 mg | ORAL_TABLET | Freq: Three times a day (TID) | ORAL | 0 refills | Status: DC | PRN
  Filled 2023-10-04: qty 30, 10d supply, fill #0

## 2023-10-04 NOTE — Telephone Encounter (Signed)
Ok for school note from 10/02/23-10/04/23, upload to [employer]

## 2023-10-04 NOTE — Patient Instructions (Signed)
 Community-Acquired Pneumonia, Adult  Pneumonia is a lung infection that causes inflammation and the buildup of mucus and fluids in the lungs. This may cause coughing and difficulty breathing. Community-acquired pneumonia is pneumonia that develops in people who are not, and have not recently been, in a hospital or other health care facility.  Usually, pneumonia develops as a result of an illness that is caused by a virus, such as the common cold and the flu (influenza). It can also be caused by bacteria or fungi. While the common cold and influenza can pass from person to person (are contagious), pneumonia itself is not considered contagious.  What are the causes?    This condition may be caused by:  Viruses.  Bacteria.  Fungi.  What increases the risk?  The following factors may make you more likely to develop this condition:  Being over age 8 or having certain medical conditions, such as:  A long-term (chronic) disease, such as: chronic obstructive pulmonary disease (COPD), asthma, heart failure, diabetes, or kidney disease.  A condition that increases the risk of breathing in (aspirating) mucus and other fluids from your mouth and nose.  A weakened body defense system (immune system).  Having had your spleen removed (splenectomy). The spleen is the organ that helps fight germs and infections.  Not cleaning your teeth and gums well (poor dental hygiene).  Using tobacco products.  Traveling to places where germs that cause pneumonia are present or being near certain animals or animal habitats that could have germs that cause pneumonia.  What are the signs or symptoms?  Symptoms of this condition include:  A dry cough or a wet (productive) cough.  A fever, sweating, or chills.  Chest pain, especially when breathing deeply or coughing.  Fast breathing, difficulty breathing, or shortness of breath.  Tiredness (fatigue) and muscle aches.  How is this diagnosed?    This condition may be diagnosed based on your medical  history or a physical exam. You may also have tests, including:  Imaging, such as a chest X-ray or lung ultrasound.  Tests of:  The level of oxygen and other gases in your blood.  Mucus from your lungs (sputum).  Fluid around your lungs (pleural fluid).  Your urine.  How is this treated?  Treatment for this condition depends on many factors, such as the cause of your pneumonia, your medicines, and other medical conditions that you have.  For most adults, pneumonia may be treated at home. In some cases, treatment must happen in a hospital and may include:  Medicines that are given by mouth (orally) or through an IV, including:  Antibiotic medicines, if bacteria caused the pneumonia.  Medicines that kill viruses (antiviral medicines), if a virus caused the pneumonia.  Oxygen therapy.  Severe pneumonia, although rare, may require the following treatments:  Mechanical ventilation.This procedure uses a machine to help you breathe if you cannot breathe well on your own or maintain a safe level of blood oxygen.  Thoracentesis. This procedure removes any buildup of pleural fluid to help with breathing.  Follow these instructions at home:    Medicines  Take over-the-counter and prescription medicines only as told by your health care provider.  Take cough medicine only if you have trouble sleeping. Cough medicine can prevent your body from removing mucus from your lungs.  If you were prescribed antibiotics, take them as told by your health care provider. Do not stop taking the antibiotic even if you start to feel better.  Lifestyle  Do not drink alcohol.  Do not use any products that contain nicotine or tobacco. These products include cigarettes, chewing tobacco, and vaping devices, such as e-cigarettes. If you need help quitting, ask your health care provider.  Eat a healthy diet. This includes plenty of vegetables, fruits, whole grains, low-fat dairy products, and lean protein.  General instructions  Rest a lot and  get at least 8 hours of sleep each night.  Sleep in a partly upright position at night. Place a few pillows under your head or sleep in a reclining chair.  Return to your normal activities as told by your health care provider. Ask your health care provider what activities are safe for you.  Drink enough fluid to keep your urine pale yellow. This helps to thin the mucus in your lungs.  If your throat is sore, gargle with a mixture of salt and water 3-4 times a day or as needed. To make salt water, completely dissolve -1 tsp (3-6 g) of salt in 1 cup (237 mL) of warm water.  Keep all follow-up visits.  How is this prevented?  You can lower your risk of developing community-acquired pneumonia by:  Getting the pneumonia vaccine. There are different types and schedules of pneumonia vaccines. Ask your health care provider which option is best for you. Consider getting the pneumonia vaccine if:  You are older than 20 years of age.  You are 78-57 years of age and are receiving cancer treatment, have chronic lung disease, or have other medical conditions that affect your immune system. Ask your health care provider if this applies to you.  Getting your influenza vaccine every year. Ask your health care provider which type of vaccine is best for you.  Getting regular dental checkups.  Washing your hands often with soap and water for at least 20 seconds. If soap and water are not available, use hand sanitizer.  Contact a health care provider if:  You have a fever.  You have trouble sleeping because you cannot control your cough with cough medicine.  Get help right away if:  Your shortness of breath becomes worse.  Your chest pain increases.  Your sickness becomes worse, especially if you are an older adult or have a weak immune system.  You cough up blood.  These symptoms may be an emergency. Get help right away. Call 911.  Do not wait to see if the symptoms will go away.  Do not drive yourself to the  hospital.  Summary  Pneumonia is an infection of the lungs.  Community-acquired pneumonia develops in people who have not been in the hospital. It can be caused by bacteria, viruses, or fungi.  This condition may be treated with antibiotics or antiviral medicines.  Severe pneumonia may require a hospital stay and treatment to help with breathing.  This information is not intended to replace advice given to you by your health care provider. Make sure you discuss any questions you have with your health care provider.  Document Revised: 12/16/2021 Document Reviewed: 12/16/2021  Elsevier Patient Education  2024 [employer].

## 2023-10-04 NOTE — Progress Notes (Signed)
Subjective:    Patient ID: Robin Harris, female    DOB: 25-Feb-2001, 22 y.o.   MRN: [id number]  HPI  Patient presents to clinic today for multiple urgent care and ER follow-up.  She initially did an e-visit 11/26 for URI symptoms.  She was diagnosed with sinusitis and treated with augmentin.  She continued to have worsening symptoms including cough, wheezing and shortness of breath.  She presented to urgent care 11/30.  She was given an albuterol neb as well as a depo-medrol injection.  She was diagnosed with sinobronchitis and discharged with a medrol dosepak and albuterol inhaler.  She subsequently presented to the ER 12/1 with persistent URI symptoms, cough, wheezing and shortness of breath.  Labs revealed a WBC count of 15.3, otherwise unremarkable.  Chest x-ray showed a right upper lobe infiltrate consistent with pneumonia.  Her augmentin was changed to cefpodoxime, azithromycin.  She was also given DuoNebs as needed for shortness of breath and fluconazole for antibiotic induced yeast infection.  Since that time, she reports she is feeling better.  She still has headache, runny nose, nasal congestion and cough although she reports she is feeling much better. She has not had any fevers. She is still taking breathing treatments every 4 hours. She reports she just picked up her antibiotics and got started on them. She is having a lot of nausea and diarrhea from the antibiotics and steroids causing rectal irritation. She is wondering what she can use OTC for this.  Review of Systems   Past Medical History:  Diagnosis Date   ALL (acute lymphoblastic leukemia) (HCC) 2007   semiannually sees Dr. Doylene [facility]), in remission ==> rec yearly CBC, CMP, CPE, and echo Q14yrs - and plz fax to [facility] peds onc (see note dated 10/2012)   Arthritis    pt states she does not have arthritis    Current Outpatient Medications  Medication Sig Dispense Refill   albuterol (VENTOLIN HFA) 108 (90 Base) MCG/ACT inhaler  Inhale 1-2 puffs into the lungs every 6 (six) hours as needed for wheezing or shortness of breath. 18 g 0   amoxicillin-clavulanate (AUGMENTIN) 875-125 MG tablet Take 1 tablet by mouth 2 (two) times daily for 7 days. 14 tablet 0   azithromycin (ZITHROMAX) 250 MG tablet Take first 2 tablets together day one, then 1 every day until finished. 6 tablet 0   cefpodoxime (VANTIN) 200 MG tablet Take 1 tablet (200 mg total) by mouth 2 (two) times daily for 7 days. 14 tablet 0   clobetasol cream (TEMOVATE) 0.05 % Apply to affected area as directed daily up to 5 days a week to eczema on hands and arms as needed for flares, avoid face, groin, axilla 60 g 2   fluconazole (DIFLUCAN) 150 MG tablet Take 1 tablet (150 mg total) by mouth daily for 1 dose. 1 tablet 0   ibuprofen (ADVIL) 800 MG tablet Take 1 tablet (800 mg total) by mouth 3 (three) times daily. 90 tablet 5   ipratropium-albuterol (DUONEB) 0.5-2.5 (3) MG/3ML SOLN Take 3 mLs by nebulization every 4 (four) hours as needed. 360 mL 0   methylPREDNISolone (MEDROL DOSEPAK) 4 MG TBPK tablet As directed 21 tablet 0   Norgestimate-Ethinyl Estradiol Triphasic (TRI-VYLIBRA LO) 0.18/0.215/0.25 MG-25 MCG tab Take 1 tablet by mouth daily. Need office visit for  more refills 28 tablet 2   ondansetron (ZOFRAN-ODT) 4 MG disintegrating tablet Take 1 tablet (4 mg total) by mouth every 8 (eight) hours as needed for  nausea or vomiting. 20 tablet 0   UNKNOWN TO PATIENT OTC Multi-Symptom Cold.     No current facility-administered medications for this visit.    Allergies  Allergen Reactions   Prednisone Other (See Comments)    GI upset.    Sulfamethoxazole-Trimethoprim Rash    Family History  Problem Relation Age of Onset   Diabetes Maternal Grandmother    Hyperlipidemia Maternal Grandmother    Coronary artery disease Neg Hx    Stroke Neg Hx     Social History   Socioeconomic History   Marital status: Single    Spouse name: Not on file   Number of children:  Not on file   Years of education: Not on file   Highest education level: Not on file  Occupational History   Not on file  Tobacco Use   Smoking status: Never    Passive exposure: Never   Smokeless tobacco: Never  Vaping Use   Vaping status: Never Used  Substance and Sexual Activity   Alcohol use: No   Drug use: Never   Sexual activity: Never  Other Topics Concern   Not on file  Social History Narrative   Lives with mom and sister Robin Harris).  No pets.   No smokers at home.   Social Determinants of Health   Financial Resource Strain: Low Risk  (09/19/2023)   Overall Financial Resource Strain (CARDIA)    Difficulty of Paying Living Expenses: Not hard at all  Food Insecurity: No Food Insecurity (09/19/2023)   Hunger Vital Sign    Worried About Running Out of Food in the Last Year: Never true    Ran Out of Food in the Last Year: Never true  Transportation Needs: No Transportation Needs (09/19/2023)   PRAPARE - Administrator, Civil Service (Medical): No    Lack of Transportation (Non-Medical): No  Physical Activity: Sufficiently Active (09/19/2023)   Exercise Vital Sign    Days of Exercise per Week: 4 days    Minutes of Exercise per Session: 40 min  Stress: No Stress Concern Present (09/19/2023)   [employer] of Occupational Health - Occupational Stress Questionnaire    Feeling of Stress : Not at all  Social Connections: Moderately Integrated (09/19/2023)   Social Connection and Isolation Panel [NHANES]    Frequency of Communication with Friends and Family: More than three times a week    Frequency of Social Gatherings with Friends and Family: More than three times a week    Attends Religious Services: More than 4 times per year    Active Member of [employer] or Organizations: No    Attends Banker Meetings: Never    Marital Status: Living with partner  Intimate Partner Violence: Not At Risk (09/19/2023)   Humiliation, Afraid, Rape, and Kick  questionnaire    Fear of Current or Ex-Partner: No    Emotionally Abused: No    Physically Abused: No    Sexually Abused: No     Constitutional: Patient reports fatigue, headache.  Denies fever, malaise, or abrupt weight changes.  HEENT: Patient reports runny nose, nasal congestion.  Denies eye pain, eye redness, ear pain, ringing in the ears, wax buildup, bloody nose, or sore throat. Respiratory: Patient reports cough.  Denies difficulty breathing, shortness of breath, or sputum production.   Cardiovascular: Denies chest pain, chest tightness, palpitations or swelling in the hands or feet.  Gastrointestinal: Patient reports poor appetite, nausea and diarrhea.  Denies abdominal pain, bloating, constipation, or blood  in the stool.   No other specific complaints in a complete review of systems (except as listed in HPI above).      Objective:   Physical Exam  BP 110/72   Pulse 99   Resp 16   Wt 166 lb 12.8 oz (75.7 kg)   LMP 09/13/2023 (Approximate)   SpO2 99%   BMI 30.51 kg/m   Wt Readings from Last 3 Encounters:  10/02/23 165 lb (74.8 kg) (90%, Z= 1.25)*  10/01/23 165 lb (74.8 kg) (90%, Z= 1.25)*  09/19/23 170 lb 12.8 oz (77.5 kg) (92%, Z= 1.39)*   * Growth percentiles are based on CDC (Girls, 2-20 Years) data.    General: Appears her stated age, in NAD. Skin: Warm, dry and intact. HEENT: Head: normal shape and size; Eyes: sclera white, no icterus, conjunctiva pink, PERRLA and EOMs intact;  Neck: No adenopathy noted. Cardiovascular: Normal rate and rhythm. S1,S2 noted.  No murmur, rubs or gallops noted. No JVD or BLE edema. No carotid bruits noted. Pulmonary/Chest: Normal effort and positive vesicular breath sounds but diminished in the right upper lobe. No respiratory distress. No wheezes, rales or ronchi noted.  Musculoskeletal: No difficulty with gait.  Neurological: Alert and oriented.   BMET    Component Value Date/Time   NA 136 10/02/2023 2124   K 3.6  10/02/2023 2124   CL 103 10/02/2023 2124   CO2 21 (L) 10/02/2023 2124   GLUCOSE 89 10/02/2023 2124   BUN 13 10/02/2023 2124   CREATININE 0.83 10/02/2023 2124   CREATININE 0.88 09/19/2023 0855   CALCIUM 9.5 10/02/2023 2124   GFRNONAA >60 10/02/2023 2124    Lipid Panel     Component Value Date/Time   CHOL 227 (H) 09/19/2023 0855   TRIG 98 (H) 09/19/2023 0855   HDL 53 09/19/2023 0855   CHOLHDL 4.3 09/19/2023 0855   LDLCALC 153 (H) 09/19/2023 0855    CBC    Component Value Date/Time   WBC 15.3 (H) 10/02/2023 2124   RBC 4.20 10/02/2023 2124   HGB 13.5 10/02/2023 2124   HCT 39.1 10/02/2023 2124   PLT 188 10/02/2023 2124   MCV 93.1 10/02/2023 2124   MCH 32.1 10/02/2023 2124   MCHC 34.5 10/02/2023 2124   RDW 11.9 10/02/2023 2124   LYMPHSABS 0.9 10/02/2023 2124   MONOABS 1.0 10/02/2023 2124   EOSABS 0.3 10/02/2023 2124   BASOSABS 0.1 10/02/2023 2124    Hgb A1C Lab Results  Component Value Date   HGBA1C 5.2 09/19/2023            Assessment & Plan:  Urgent care/ER follow-up for sinobronchitis, community-acquired pneumonia:  Urgent care and ER notes, labs and imaging reviewed Repeat CBC today Continue antibiotics until course completed Albuterol inhaler refilled today Continue neb treatments as previously prescribed Zofran refilled today Advised her to take a probiotic or eat Activia yogurt daily Advised her to advance her activity as tolerated We will only plan to repeat chest x-ray if symptoms persist or worsen at 4 weeks  Return precautions discussed Nicki Reaper, NP

## 2023-10-08 LAB — CULTURE, BLOOD (ROUTINE X 2)
Culture: NO GROWTH
Culture: NO GROWTH
Special Requests: ADEQUATE
Special Requests: ADEQUATE

## 2023-10-10 ENCOUNTER — Other Ambulatory Visit: Payer: Self-pay

## 2023-10-18 ENCOUNTER — Ambulatory Visit: Payer: Self-pay | Admitting: *Deleted

## 2023-10-18 ENCOUNTER — Encounter: Payer: Self-pay | Admitting: Internal Medicine

## 2023-10-18 NOTE — Telephone Encounter (Signed)
Reason for Disposition  [1] MODERATE longstanding difficulty breathing (e.g., speaks in phrases, SOB even at rest, pulse 100-120) AND [2] SAME as normal  Answer Assessment - Initial Assessment Questions 1. RESPIRATORY STATUS: "Describe your breathing?" (e.g., wheezing, shortness of breath, unable to speak, severe coughing)      Shortness of breath and wheezing    I had pneumonia a couple of weeks ago.   After finishing antibiotic and now I'm not doing well.   I'm wheezing.   It barely helps.    2. ONSET: "When did this breathing problem begin?"      Seen last 2 Tues. Ago.   3. PATTERN "Does the difficult breathing come and go, or has it been constant since it started?"      All the time now. 4. SEVERITY: "How bad is your breathing?" (e.g., mild, moderate, severe)    - MILD: No SOB at rest, mild SOB with walking, speaks normally in sentences, can lie down, no retractions, pulse < 100.    - MODERATE: SOB at rest, SOB with minimal exertion and prefers to sit, cannot lie down flat, speaks in phrases, mild retractions, audible wheezing, pulse 100-120.    - SEVERE: Very SOB at rest, speaks in single words, struggling to breathe, sitting hunched forward, retractions, pulse > 120      Mild 5. RECURRENT SYMPTOM: "Have you had difficulty breathing before?" If Yes, ask: "When was the last time?" and "What happened that time?"      Not asked 6. CARDIAC HISTORY: "Do you have any history of heart disease?" (e.g., heart attack, angina, bypass surgery, angioplasty)      Not asked 7. LUNG HISTORY: "Do you have any history of lung disease?"  (e.g., pulmonary embolus, asthma, emphysema)     No 8. CAUSE: "What do you think is causing the breathing problem?"      Pneumonia 9. OTHER SYMPTOMS: "Do you have any other symptoms? (e.g., dizziness, runny nose, cough, chest pain, fever)     Coughing, 10. O2 SATURATION MONITOR:  "Do you use an oxygen saturation monitor (pulse oximeter) at home?" If Yes, ask: "What is  your reading (oxygen level) today?" "What is your usual oxygen saturation reading?" (e.g., 95%)       N/A 11. PREGNANCY: "Is there any chance you are pregnant?" "When was your last menstrual period?"       Not asked 12. TRAVEL: "Have you traveled out of the country in the last month?" (e.g., travel history, exposures)       Not asked  Protocols used: Breathing Difficulty-A-AH

## 2023-10-18 NOTE — Telephone Encounter (Signed)
  Chief Complaint: Diagnosed with pneumonia 2 weeks ago.   Completed antibiotic.   Got better but now all symptoms have returned. Symptoms: shortness of breath, wheezing, coughing Frequency: all the time Pertinent Negatives: Patient denies fever Disposition: [] ED /[] Urgent Care (no appt availability in office) / [x] Appointment(In office/virtual)/ []  [place] [facility]/ [] Home Care/ [] Refused Recommended Disposition /[]  Mobile Bus/ []  Follow-up with PCP Additional Notes: She has an appt for 10/19/2023 with Nicki Reaper, NP at 2:20.   She was hoping to be seen today, if possible.   She also sent a MyChart message to [facility].   I checked to see if appts were available today but there were not.   I let her know I would let her know pt had called in this morning and bring attention to pt's MyChart request.   Pt. Was agreeable to this plan.

## 2023-10-19 ENCOUNTER — Ambulatory Visit (INDEPENDENT_AMBULATORY_CARE_PROVIDER_SITE_OTHER): Payer: 59 | Admitting: Internal Medicine

## 2023-10-19 ENCOUNTER — Ambulatory Visit
Admission: RE | Admit: 2023-10-19 | Discharge: 2023-10-19 | Disposition: A | Discharge status: Home / Self Care | Payer: 59 | Attending: Internal Medicine | Admitting: Internal Medicine

## 2023-10-19 ENCOUNTER — Other Ambulatory Visit: Payer: Self-pay

## 2023-10-19 ENCOUNTER — Ambulatory Visit
Admission: RE | Admit: 2023-10-19 | Discharge: 2023-10-19 | Disposition: A | Discharge status: Home / Self Care | Payer: 59 | Source: Ambulatory Visit | Attending: Internal Medicine | Admitting: Internal Medicine

## 2023-10-19 ENCOUNTER — Encounter: Payer: Self-pay | Admitting: Internal Medicine

## 2023-10-19 VITALS — BP 110/78 | HR 128 | Ht 62.0 in | Wt 167.0 lb

## 2023-10-19 DIAGNOSIS — J189 Pneumonia, unspecified organism: Secondary | ICD-10-CM

## 2023-10-19 DIAGNOSIS — R062 Wheezing: Secondary | ICD-10-CM | POA: Diagnosis not present

## 2023-10-19 DIAGNOSIS — J181 Lobar pneumonia, unspecified organism: Secondary | ICD-10-CM | POA: Diagnosis not present

## 2023-10-19 DIAGNOSIS — R053 Chronic cough: Secondary | ICD-10-CM | POA: Diagnosis not present

## 2023-10-19 MED ORDER — METHYLPREDNISOLONE ACETATE 80 MG/ML IJ SUSP
80.0000 mg | Freq: Once | INTRAMUSCULAR | Status: AC
  Administered 2023-10-19: 80 mg via INTRAMUSCULAR

## 2023-10-19 MED ORDER — AIRSUPRA 90-80 MCG/ACT IN AERO
1.0000 | INHALATION_SPRAY | RESPIRATORY_TRACT | 1 refills | Status: DC | PRN
  Filled 2023-10-19: qty 10.7, 30d supply, fill #0

## 2023-10-19 MED ORDER — IPRATROPIUM-ALBUTEROL 0.5-2.5 (3) MG/3ML IN SOLN
3.0000 mL | Freq: Once | RESPIRATORY_TRACT | Status: AC
  Administered 2023-10-19: 3 mL via RESPIRATORY_TRACT

## 2023-10-19 NOTE — Patient Instructions (Signed)

## 2023-10-19 NOTE — Progress Notes (Signed)
Subjective:    Patient ID: Robin Harris, female    DOB: [birth year], 22 y.o.   MRN: [id number]  HPI  Patient presents to clinic today with complaint of persistent cough, wheezing and shortness of breath.  She reports this has been an ongoing issue since being diagnosed with pneumonia on 12/1.She initially did an e-visit 11/26 for URI symptoms.  She was diagnosed with sinusitis and treated with augmentin.  She continued to have worsening symptoms including cough, wheezing and shortness of breath.  She presented to urgent care 11/30.  She was given an albuterol neb as well as a depo-medrol injection.  She was diagnosed with sinobronchitis and discharged with a medrol dosepak and albuterol inhaler.  She subsequently presented to the ER 12/1 with persistent URI symptoms, cough, wheezing and shortness of breath.  Labs revealed a WBC count of 15.3, otherwise unremarkable.  Chest x-ray showed a right upper lobe infiltrate consistent with pneumonia.  Her augmentin was changed to cefpodoxime, azithromycin.   She reports the cough is mostly nonproductive.  She denies headache, runny nose, nasal congestion, sore throat, nausea, vomiting, diarrhea.  She denies fever, chills or bodyaches.  She has been using albuterol inhaler more frequently.  She has not tried any additional medications OTC.  Review of Systems   Past Medical History:  Diagnosis Date   ALL (acute lymphoblastic leukemia) (HCC) 2007   semiannually sees Dr. Doylene [facility]), in remission ==> rec yearly CBC, CMP, CPE, and echo Q51yrs - and plz fax to [facility] peds onc (see note dated 10/2012)   Arthritis    pt states she does not have arthritis    Current Outpatient Medications  Medication Sig Dispense Refill   albuterol (VENTOLIN HFA) 108 (90 Base) MCG/ACT inhaler Inhale 1-2 puffs into the lungs every 6 (six) hours as needed for wheezing or shortness of breath. 6.7 g 0   clobetasol cream (TEMOVATE) 0.05 % Apply to affected area as directed daily up  to 5 days a week to eczema on hands and arms as needed for flares, avoid face, groin, axilla 60 g 2   ibuprofen (ADVIL) 800 MG tablet Take 1 tablet (800 mg total) by mouth 3 (three) times daily. 90 tablet 5   ipratropium-albuterol (DUONEB) 0.5-2.5 (3) MG/3ML SOLN Take 3 mLs by nebulization every 4 (four) hours as needed. 360 mL 0   Norgestimate-Ethinyl Estradiol Triphasic (TRI-VYLIBRA LO) 0.18/0.215/0.25 MG-25 MCG tab Take 1 tablet by mouth daily. Need office visit for  more refills 28 tablet 2   ondansetron (ZOFRAN-ODT) 4 MG disintegrating tablet Take 1 tablet (4 mg total) by mouth every 8 (eight) hours as needed for nausea or vomiting. 30 tablet 0   UNKNOWN TO PATIENT OTC Multi-Symptom Cold.     No current facility-administered medications for this visit.    Allergies  Allergen Reactions   Prednisone Other (See Comments)    GI upset.    Sulfamethoxazole-Trimethoprim Rash    Family History  Problem Relation Age of Onset   Diabetes Maternal Grandmother    Hyperlipidemia Maternal Grandmother    Coronary artery disease Neg Hx    Stroke Neg Hx     Social History   Socioeconomic History   Marital status: Single    Spouse name: Not on file   Number of children: Not on file   Years of education: Not on file   Highest education level: Not on file  Occupational History   Not on file  Tobacco Use   Smoking  status: Never    Passive exposure: Never   Smokeless tobacco: Never  Vaping Use   Vaping status: Never Used  Substance and Sexual Activity   Alcohol use: No   Drug use: Never   Sexual activity: Never  Other Topics Concern   Not on file  Social History Narrative   Lives with mom and sister [name]).  No pets.   No smokers at home.   Social Drivers of Corporate investment banker Strain: Low Risk  (09/19/2023)   Overall Financial Resource Strain (CARDIA)    Difficulty of Paying Living Expenses: Not hard at all  Food Insecurity: No Food Insecurity (09/19/2023)   Hunger  Vital Sign    Worried About Running Out of Food in the Last Year: Never true    Ran Out of Food in the Last Year: Never true  Transportation Needs: No Transportation Needs (09/19/2023)   PRAPARE - Administrator, Civil Service (Medical): No    Lack of Transportation (Non-Medical): No  Physical Activity: Sufficiently Active (09/19/2023)   Exercise Vital Sign    Days of Exercise per Week: 4 days    Minutes of Exercise per Session: 40 min  Stress: No Stress Concern Present (09/19/2023)   [employer] of Occupational Health - Occupational Stress Questionnaire    Feeling of Stress : Not at all  Social Connections: Moderately Integrated (09/19/2023)   Social Connection and Isolation Panel [NHANES]    Frequency of Communication with Friends and Family: More than three times a week    Frequency of Social Gatherings with Friends and Family: More than three times a week    Attends Religious Services: More than 4 times per year    Active Member of [employer] or Organizations: No    Attends Banker Meetings: Never    Marital Status: Living with partner  Intimate Partner Violence: Not At Risk (09/19/2023)   Humiliation, Afraid, Rape, and Kick questionnaire    Fear of Current or Ex-Partner: No    Emotionally Abused: No    Physically Abused: No    Sexually Abused: No     Constitutional: Denies fever, fatigue, headache, malaise, or abrupt weight changes.  HEENT: Patient reports ear pain.  Denies eye pain, eye redness, runny nose, nasal congestion, ringing in the ears, wax buildup, bloody nose, or sore throat. Respiratory: Patient reports cough, wheezing, shortness of breath.  Denies difficulty breathing.   Cardiovascular: Denies chest pain, chest tightness, palpitations or swelling in the hands or feet.  Gastrointestinal: Denies abdominal pain, bloating, nausea, diarrhea,    constipation, or blood in the stool.   No other specific complaints in a complete review of  systems (except as listed in HPI above).      Objective:   Physical Exam  LMP 09/13/2023 (Approximate)   Wt Readings from Last 3 Encounters:  10/04/23 166 lb 12.8 oz (75.7 kg) (90%, Z= 1.30)*  10/02/23 165 lb (74.8 kg) (90%, Z= 1.25)*  10/01/23 165 lb (74.8 kg) (90%, Z= 1.25)*   * Growth percentiles are based on CDC (Girls, 2-20 Years) data.    General: Appears her stated age, appears unwell but in NAD. Skin: Warm, dry and intact. HEENT: Head: normal shape and size; Eyes: sclera white, no icterus, conjunctiva pink, PERRLA and EOMs intact;  Neck: No adenopathy noted. Cardiovascular: Tachycardic with normal rhythm.  Pulmonary/Chest: Normal effort and positive vesicular breath with bilateral expiratory wheezing noted. No respiratory distress. No rales or ronchi noted.  Musculoskeletal:  No difficulty with gait.  Neurological: Alert and oriented.   BMET    Component Value Date/Time   NA 136 10/02/2023 2124   K 3.6 10/02/2023 2124   CL 103 10/02/2023 2124   CO2 21 (L) 10/02/2023 2124   GLUCOSE 89 10/02/2023 2124   BUN 13 10/02/2023 2124   CREATININE 0.83 10/02/2023 2124   CREATININE 0.88 09/19/2023 0855   CALCIUM 9.5 10/02/2023 2124   GFRNONAA >60 10/02/2023 2124    Lipid Panel     Component Value Date/Time   CHOL 227 (H) 09/19/2023 0855   TRIG 98 (H) 09/19/2023 0855   HDL 53 09/19/2023 0855   CHOLHDL 4.3 09/19/2023 0855   LDLCALC 153 (H) 09/19/2023 0855    CBC    Component Value Date/Time   WBC 6.2 10/04/2023 1315   RBC 4.28 10/04/2023 1315   HGB 13.2 10/04/2023 1315   HCT 40.6 10/04/2023 1315   PLT 223 10/04/2023 1315   MCV 94.9 10/04/2023 1315   MCH 30.8 10/04/2023 1315   MCHC 32.5 10/04/2023 1315   RDW 11.8 10/04/2023 1315   LYMPHSABS 0.9 10/02/2023 2124   MONOABS 1.0 10/02/2023 2124   EOSABS 0.3 10/02/2023 2124   BASOSABS 0.1 10/02/2023 2124    Hgb A1C Lab Results  Component Value Date   HGBA1C 5.2 09/19/2023            Assessment &  Plan:   Community-acquired pneumonia:  Persistent symptoms despite treatment She is on able to tolerate oral prednisone due to severe GI side effects DuoNeb given in office 80 mg Depo-Medrol IM x 1 Will obtain stat chest x-ray for further evaluation, no antibiotics unless infiltrate seen on chest x-ray Stop albuterol Rx for airsupra 1 puff every 4-6 hours as needed If insurance will not cover Airsupra, then will continue albuterol and send in Rx for Flovent or Pulmicort twice daily Urgent care/ER precautions discussed  Return precautions discussed Nicki Reaper, NP

## 2023-10-20 ENCOUNTER — Other Ambulatory Visit: Payer: Self-pay

## 2023-10-28 ENCOUNTER — Other Ambulatory Visit: Payer: Self-pay

## 2023-10-28 ENCOUNTER — Other Ambulatory Visit: Payer: Self-pay | Admitting: Internal Medicine

## 2023-10-28 ENCOUNTER — Other Ambulatory Visit (HOSPITAL_COMMUNITY): Payer: Self-pay

## 2023-10-28 MED ORDER — AIRSUPRA 90-80 MCG/ACT IN AERO
1.0000 | INHALATION_SPRAY | RESPIRATORY_TRACT | 1 refills | Status: DC | PRN
  Filled 2023-10-28 – 2023-11-14 (×5): qty 10.7, 30d supply, fill #0
  Filled 2023-12-26: qty 10.7, 30d supply, fill #1

## 2023-10-30 ENCOUNTER — Other Ambulatory Visit (HOSPITAL_COMMUNITY): Payer: Self-pay

## 2023-10-31 ENCOUNTER — Other Ambulatory Visit (HOSPITAL_COMMUNITY): Payer: Self-pay

## 2023-11-01 ENCOUNTER — Other Ambulatory Visit: Payer: Self-pay | Admitting: Internal Medicine

## 2023-11-05 ENCOUNTER — Other Ambulatory Visit (HOSPITAL_COMMUNITY): Payer: Self-pay

## 2023-11-05 MED ORDER — NORGESTIM-ETH ESTRAD TRIPHASIC 0.18/0.215/0.25 MG-25 MCG PO TABS
1.0000 | ORAL_TABLET | Freq: Every day | ORAL | 3 refills | Status: DC
  Filled 2023-11-05 – 2023-11-07 (×2): qty 84, 84d supply, fill #0

## 2023-11-05 NOTE — Telephone Encounter (Signed)
 Requested Prescriptions  Pending Prescriptions Disp Refills   Norgestim-Eth Estrad Triphasic (NORGESTIMATE -ETHINYL ESTRADIOL  TRIPHASIC) 0.18/0.215/0.25 MG-25 MCG tab 84 tablet 3    Sig: Take 1 tablet by mouth daily. Need office visit for  more refills     OB/GYN:  Contraceptives Passed - 11/05/2023 10:54 AM      Passed - Last BP in normal range    BP Readings from Last 1 Encounters:  10/19/23 110/78         Passed - Valid encounter within last 12 months    Recent Outpatient Visits           2 weeks ago Community acquired pneumonia of right upper lobe of lung   [place] [facility] [place], Angeline ORN, NP   1 month ago Community acquired pneumonia of right upper lobe of lung   [facility] [place], Angeline ORN, NP   1 month ago Encounter for general adult medical examination with abnormal findings   [place] [facility] [place], Angeline ORN, NP   1 year ago Encounter for general adult medical examination with abnormal findings   [place] [facility] [place], Angeline ORN, NP   1 year ago Sore throat   [place] [facility] [place], [place] BRAVO, [place]       Future Appointments             In 10 months Baity, Angeline ORN, NP [place] [facility], [facility]            Passed - Patient is not a smoker

## 2023-11-07 ENCOUNTER — Other Ambulatory Visit (HOSPITAL_COMMUNITY): Payer: Self-pay

## 2023-11-07 ENCOUNTER — Other Ambulatory Visit: Payer: Self-pay

## 2023-11-14 ENCOUNTER — Other Ambulatory Visit: Payer: Self-pay

## 2023-12-27 ENCOUNTER — Other Ambulatory Visit: Payer: Self-pay

## 2024-01-11 DIAGNOSIS — M542 Cervicalgia: Secondary | ICD-10-CM | POA: Diagnosis not present

## 2024-01-11 DIAGNOSIS — M9903 Segmental and somatic dysfunction of lumbar region: Secondary | ICD-10-CM | POA: Diagnosis not present

## 2024-01-11 DIAGNOSIS — M9902 Segmental and somatic dysfunction of thoracic region: Secondary | ICD-10-CM | POA: Diagnosis not present

## 2024-01-11 DIAGNOSIS — M9901 Segmental and somatic dysfunction of cervical region: Secondary | ICD-10-CM | POA: Diagnosis not present

## 2024-02-28 ENCOUNTER — Ambulatory Visit: Admitting: Internal Medicine

## 2024-03-15 ENCOUNTER — Encounter: Payer: Self-pay | Admitting: Internal Medicine

## 2024-03-22 ENCOUNTER — Ambulatory Visit: Admitting: Internal Medicine

## 2024-03-22 ENCOUNTER — Encounter: Payer: Self-pay | Admitting: Internal Medicine

## 2024-03-22 VITALS — BP 110/70 | Ht 62.0 in | Wt 168.4 lb

## 2024-03-22 DIAGNOSIS — F419 Anxiety disorder, unspecified: Secondary | ICD-10-CM | POA: Diagnosis not present

## 2024-03-22 DIAGNOSIS — E7800 Pure hypercholesterolemia, unspecified: Secondary | ICD-10-CM

## 2024-03-22 DIAGNOSIS — N926 Irregular menstruation, unspecified: Secondary | ICD-10-CM | POA: Diagnosis not present

## 2024-03-22 NOTE — Patient Instructions (Signed)

## 2024-03-22 NOTE — Progress Notes (Signed)
 Subjective:    Patient ID: Robin Harris, female    DOB: 04/06/2001, 22 y.o.   MRN: [id number]  HPI  Discussed the use of AI scribe software for clinical note transcription with the patient, who gave verbal consent to proceed.  Robin Harris is a 23 year old female who presents with concerns about menstrual irregularities and high cholesterol.  She has experienced a delay in her menstrual cycle, with her period arriving five to six days later than usual, causing significant anxiety. A pregnancy test was negative. She is not currently on any birth control, having discontinued it in March due to depressive symptoms.  She is concerned about her cholesterol levels, given her family history of high cholesterol affecting her grandmother, mother, and aunt, all of whom are on medication. Her last cholesterol check in November 2024 showed an LDL of 153 mg/dL and triglycerides of 98 mg/dL. Despite following a diet primarily consisting of grilled chicken salads and avoiding eggs and nuts, she remains worried about her cholesterol levels.  She has been experiencing significant stress over the past year, particularly related to family illnesses and the unexpected death of her grandfather, which she witnessed. This stress has contributed to her anxiety, especially when family members become ill.     Review of Systems     Past Medical History:  Diagnosis Date   ALL (acute lymphoblastic leukemia) (HCC) 2007   semiannually sees Dr. Pecola Bouquet [facility]), in remission ==> rec yearly CBC, CMP, CPE, and echo Q69yrs - and plz fax to [facility] peds onc (see note dated 10/2012)   Arthritis    pt states she does not have arthritis    Current Outpatient Medications  Medication Sig Dispense Refill   Albuterol -Budesonide  (AIRSUPRA ) 90-80 MCG/ACT AERO Inhale 1 puff into the lungs every 4 (four) hours as needed. 10.7 g 1   clobetasol  cream (TEMOVATE ) 0.05 % Apply to affected area as directed daily up to 5 days a week to  eczema on hands and arms as needed for flares, avoid face, groin, axilla 60 g 2   ibuprofen  (ADVIL ) 800 MG tablet Take 1 tablet (800 mg total) by mouth 3 (three) times daily. 90 tablet 5   ipratropium-albuterol  (DUONEB) 0.5-2.5 (3) MG/3ML SOLN Take 3 mLs by nebulization every 4 (four) hours as needed. 360 mL 0   Norgestim-Eth Estrad Triphasic (NORGESTIMATE -ETHINYL ESTRADIOL  TRIPHASIC) 0.18/0.215/0.25 MG-25 MCG tab Take 1 tablet by mouth daily. Need office visit for  more refills 84 tablet 3   ondansetron  (ZOFRAN -ODT) 4 MG disintegrating tablet Take 1 tablet (4 mg total) by mouth every 8 (eight) hours as needed for nausea or vomiting. 30 tablet 0   UNKNOWN TO PATIENT OTC Multi-Symptom Cold.     No current facility-administered medications for this visit.    Allergies  Allergen Reactions   Prednisone  Other (See Comments)    GI upset.    Sulfamethoxazole-Trimethoprim Rash    Family History  Problem Relation Age of Onset   Diabetes Maternal Grandmother    Hyperlipidemia Maternal Grandmother    Coronary artery disease Neg Hx    Stroke Neg Hx     Social History   Socioeconomic History   Marital status: Single    Spouse name: Not on file   Number of children: Not on file   Years of education: Not on file   Highest education level: Not on file  Occupational History   Not on file  Tobacco Use   Smoking status: Never  Passive exposure: Never   Smokeless tobacco: Never  Vaping Use   Vaping status: Never Used  Substance and Sexual Activity   Alcohol use: No   Drug use: Never   Sexual activity: Never  Other Topics Concern   Not on file  Social History Narrative   Lives with mom and sister Verdie Gladden).  No pets.   No smokers at home.   Social Drivers of Corporate investment banker Strain: Low Risk  (09/19/2023)   Overall Financial Resource Strain (CARDIA)    Difficulty of Paying Living Expenses: Not hard at all  Food Insecurity: No Food Insecurity (09/19/2023)   Hunger Vital  Sign    Worried About Running Out of Food in the Last Year: Never true    Ran Out of Food in the Last Year: Never true  Transportation Needs: No Transportation Needs (09/19/2023)   PRAPARE - Administrator, Civil Service (Medical): No    Lack of Transportation (Non-Medical): No  Physical Activity: Sufficiently Active (09/19/2023)   Exercise Vital Sign    Days of Exercise per Week: 4 days    Minutes of Exercise per Session: 40 min  Stress: No Stress Concern Present (09/19/2023)   [employer] of Occupational Health - Occupational Stress Questionnaire    Feeling of Stress : Not at all  Social Connections: Moderately Integrated (09/19/2023)   Social Connection and Isolation Panel [NHANES]    Frequency of Communication with Friends and Family: More than three times a week    Frequency of Social Gatherings with Friends and Family: More than three times a week    Attends Religious Services: More than 4 times per year    Active Member of [employer] or Organizations: No    Attends Banker Meetings: Never    Marital Status: Living with partner  Intimate Partner Violence: Not At Risk (09/19/2023)   Humiliation, Afraid, Rape, and Kick questionnaire    Fear of Current or Ex-Partner: No    Emotionally Abused: No    Physically Abused: No    Sexually Abused: No     Constitutional: Denies fever, malaise, fatigue, headache or abrupt weight changes.  HEENT: Denies eye pain, eye redness, ear pain, ringing in the ears, wax buildup, runny nose, nasal congestion, bloody nose, or sore throat. Respiratory: Denies difficulty breathing, shortness of breath, cough or sputum production.   Cardiovascular: Denies chest pain, chest tightness, palpitations or swelling in the hands or feet.  Gastrointestinal: Denies abdominal pain, bloating, constipation, diarrhea or blood in the stool.  GU: Pt reports late menses. Denies urgency, frequency, pain with urination, burning sensation, blood in  urine, odor or discharge. Musculoskeletal: Denies decrease in range of motion, difficulty with gait, muscle pain or joint pain and swelling.  Skin: Denies redness, rashes, lesions or ulcercations.  Neurological: Denies dizziness, difficulty with memory, difficulty with speech or problems with balance and coordination.  Psych: Pt has a history of anxiety and depression. Denies SI/HI.  No other specific complaints in a complete review of systems (except as listed in HPI above).  Objective:   Physical Exam  BP 110/70 (BP Location: Left Arm, Patient Position: Sitting, Cuff Size: Normal)   Ht 5\' 2"  (1.575 m)   Wt 168 lb 6.4 oz (76.4 kg)   LMP 03/17/2024 (Approximate)   BMI 30.80 kg/m     Wt Readings from Last 3 Encounters:  10/19/23 167 lb (75.8 kg)  10/04/23 166 lb 12.8 oz (75.7 kg) (90%, Z= 1.30)*  10/02/23 165 lb (74.8 kg) (90%, Z= 1.25)*   * Growth percentiles are based on CDC (Girls, 2-20 Years) data.    General: Appears her stated age, obese, in NAD. Skin: Warm, dry and intact.  Cardiovascular: Normal rate. Pulmonary/Chest: Normal effort. Neurological: Alert and oriented.  Psychiatric: Mood and affect normal. Behavior is normal. Judgment and thought content normal.    BMET    Component Value Date/Time   NA 136 10/02/2023 2124   K 3.6 10/02/2023 2124   CL 103 10/02/2023 2124   CO2 21 (L) 10/02/2023 2124   GLUCOSE 89 10/02/2023 2124   BUN 13 10/02/2023 2124   CREATININE 0.83 10/02/2023 2124   CREATININE 0.88 09/19/2023 0855   CALCIUM 9.5 10/02/2023 2124   GFRNONAA >60 10/02/2023 2124    Lipid Panel     Component Value Date/Time   CHOL 227 (H) 09/19/2023 0855   TRIG 98 (H) 09/19/2023 0855   HDL 53 09/19/2023 0855   CHOLHDL 4.3 09/19/2023 0855   LDLCALC 153 (H) 09/19/2023 0855    CBC    Component Value Date/Time   WBC 6.2 10/04/2023 1315   RBC 4.28 10/04/2023 1315   HGB 13.2 10/04/2023 1315   HCT 40.6 10/04/2023 1315   PLT 223 10/04/2023 1315   MCV  94.9 10/04/2023 1315   MCH 30.8 10/04/2023 1315   MCHC 32.5 10/04/2023 1315   RDW 11.8 10/04/2023 1315   LYMPHSABS 0.9 10/02/2023 2124   MONOABS 1.0 10/02/2023 2124   EOSABS 0.3 10/02/2023 2124   BASOSABS 0.1 10/02/2023 2124    Hgb A1C Lab Results  Component Value Date   HGBA1C 5.2 09/19/2023           Assessment & Plan:   Assessment and Plan    Anxiety Exhibits anxiety and symptoms related to recent family illnesses and bereavement. - Continue family support.  Irregular menstruation Recent delayed menstruation likely stress-induced. Not on birth control due to depressive side effects. - Monitor menstrual cycle.  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 Cholesterol ratio improved to 4.3. Increased HDL levels beneficial. Potential need for medication despite dietary efforts. - Recheck cholesterol levels today. - Encourage aerobic exercise to improve HDL levels.       RTC in 6 months for your annual exam Helayne Lo, NP

## 2024-03-23 ENCOUNTER — Ambulatory Visit: Payer: Self-pay | Admitting: Internal Medicine

## 2024-03-23 LAB — LIPID PANEL
Cholesterol: 192 mg/dL (ref <200)
HDL: 48 mg/dL — ABNORMAL LOW (ref >=50)
LDL Cholesterol (Calc): 125 mg/dL — ABNORMAL HIGH
Non-HDL Cholesterol (Calc): 144 mg/dL — ABNORMAL HIGH (ref <130)
Total CHOL/HDL Ratio: 4 (calc) (ref <5.0)
Triglycerides: 88 mg/dL (ref <150)

## 2024-03-29 ENCOUNTER — Ambulatory Visit: Admitting: Internal Medicine

## 2024-03-29 ENCOUNTER — Telehealth: Admitting: Physician Assistant

## 2024-03-29 DIAGNOSIS — J069 Acute upper respiratory infection, unspecified: Secondary | ICD-10-CM | POA: Diagnosis not present

## 2024-03-29 MED ORDER — BENZONATATE 100 MG PO CAPS: 100.0000 mg | ORAL_CAPSULE | Freq: Three times a day (TID) | ORAL | 0 refills | Status: DC | PRN

## 2024-03-29 MED ORDER — FLUTICASONE PROPIONATE 50 MCG/ACT NA SUSP: 2.0000 | Freq: Every day | NASAL | 0 refills | Status: AC

## 2024-03-29 NOTE — Progress Notes (Signed)

## 2024-05-30 DIAGNOSIS — H5203 Hypermetropia, bilateral: Secondary | ICD-10-CM | POA: Diagnosis not present

## 2024-06-28 ENCOUNTER — Other Ambulatory Visit: Payer: Self-pay | Admitting: Internal Medicine

## 2024-06-29 ENCOUNTER — Other Ambulatory Visit: Payer: Self-pay | Admitting: Internal Medicine

## 2024-06-29 ENCOUNTER — Other Ambulatory Visit: Payer: Self-pay

## 2024-06-29 NOTE — Telephone Encounter (Signed)
 Requested medications are due for refill today.  unsure  Requested medications are on the active medications list.  yes  Last refill. 10/28/2023 10.7g 1 rf  Future visit scheduled.   yes  Notes to clinic.  Medication not assigned to a protocol. Please review for refill.    Requested Prescriptions  Pending Prescriptions Disp Refills   Albuterol -Budesonide  (AIRSUPRA ) 90-80 MCG/ACT AERO 10.7 g 1    Sig: Inhale 1 puff into the lungs every 4 (four) hours as needed.     Off-Protocol Failed - 06/29/2024  5:49 PM      Failed - Medication not assigned to a protocol, review manually.      Passed - Valid encounter within last 12 months    Recent Outpatient Visits           3 months ago Pure hypercholesterolemia   [facility] [place], Angeline ORN, NP   7 years ago Injury of right ankle, initial encounter   [facility] at [facility], Debby PARAS, MD   7 years ago Sprain of right ankle, unspecified ligament, subsequent encounter   [facility] at [facility], Debby PARAS, MD   7 years ago Sprain of right ankle, unspecified ligament, subsequent encounter   [facility] at [facility], Debby PARAS, MD   7 years ago Sprain of right ankle, unspecified ligament, initial encounter   [facility] at [facility], Debby PARAS, MD       Future Appointments             In 2 months Baity, Angeline ORN, NP [place] [facility], Main [address]

## 2024-07-01 NOTE — Telephone Encounter (Signed)
 Requested medication (s) are due for refill today: yes  Requested medication (s) are on the active medication list: yes  Last refill:  10/03/23  Future visit scheduled: yes  Notes to clinic:  Medication not assigned to a protocol, review manually.      Requested Prescriptions  Pending Prescriptions Disp Refills   Albuterol -Budesonide  (AIRSUPRA ) 90-80 MCG/ACT AERO 10.7 g 1    Sig: Inhale 1 puff into the lungs every 4 (four) hours as needed.     Off-Protocol Failed - 07/01/2024  8:53 AM      Failed - Medication not assigned to a protocol, review manually.      Passed - Valid encounter within last 12 months    Recent Outpatient Visits           3 months ago Pure hypercholesterolemia   [place] [facility] [place], Angeline ORN, NP   7 years ago Injury of right ankle, initial encounter   [facility] at [facility], Debby PARAS, MD   7 years ago Sprain of right ankle, unspecified ligament, subsequent encounter   [facility] Medicine at [facility], Debby PARAS, MD   7 years ago Sprain of right ankle, unspecified ligament, subsequent encounter   [facility] Medicine at [facility], Debby PARAS, MD   7 years ago Sprain of right ankle, unspecified ligament, initial encounter   [facility] Medicine at [facility], Debby PARAS, MD       Future Appointments             In 2 months Baity, Angeline ORN, NP [place] [facility], Main [address]

## 2024-07-03 ENCOUNTER — Other Ambulatory Visit: Payer: Self-pay

## 2024-07-03 MED ORDER — AIRSUPRA 90-80 MCG/ACT IN AERO
1.0000 | INHALATION_SPRAY | RESPIRATORY_TRACT | 1 refills | Status: AC | PRN
  Filled 2024-07-03: qty 10.7, 30d supply, fill #0

## 2024-08-17 ENCOUNTER — Other Ambulatory Visit: Payer: Self-pay

## 2024-08-17 MED ORDER — FLUZONE 0.5 ML IM SUSY
0.5000 mL | PREFILLED_SYRINGE | Freq: Once | INTRAMUSCULAR | 0 refills | Status: AC
  Filled 2024-08-17: qty 0.5, 1d supply, fill #0

## 2024-09-19 ENCOUNTER — Ambulatory Visit (INDEPENDENT_AMBULATORY_CARE_PROVIDER_SITE_OTHER): Payer: Self-pay | Admitting: Internal Medicine

## 2024-09-19 ENCOUNTER — Other Ambulatory Visit: Payer: Self-pay

## 2024-09-19 ENCOUNTER — Encounter: Payer: Self-pay | Admitting: Internal Medicine

## 2024-09-19 VITALS — BP 112/72 | Ht 62.0 in | Wt 164.0 lb

## 2024-09-19 DIAGNOSIS — E7800 Pure hypercholesterolemia, unspecified: Secondary | ICD-10-CM

## 2024-09-19 DIAGNOSIS — Z6830 Body mass index (BMI) 30.0-30.9, adult: Secondary | ICD-10-CM | POA: Diagnosis not present

## 2024-09-19 DIAGNOSIS — E66811 Obesity, class 1: Secondary | ICD-10-CM | POA: Diagnosis not present

## 2024-09-19 DIAGNOSIS — Z0001 Encounter for general adult medical examination with abnormal findings: Secondary | ICD-10-CM | POA: Diagnosis not present

## 2024-09-19 DIAGNOSIS — F418 Other specified anxiety disorders: Secondary | ICD-10-CM | POA: Insufficient documentation

## 2024-09-19 DIAGNOSIS — R739 Hyperglycemia, unspecified: Secondary | ICD-10-CM | POA: Diagnosis not present

## 2024-09-19 DIAGNOSIS — E6609 Other obesity due to excess calories: Secondary | ICD-10-CM | POA: Diagnosis not present

## 2024-09-19 MED ORDER — BUSPIRONE HCL 5 MG PO TABS
5.0000 mg | ORAL_TABLET | Freq: Two times a day (BID) | ORAL | 0 refills | Status: AC | PRN
  Filled 2024-09-19: qty 60, 30d supply, fill #0

## 2024-09-19 NOTE — Assessment & Plan Note (Signed)
 Encouraged diet and exercise for weight loss ?

## 2024-09-19 NOTE — Assessment & Plan Note (Signed)
 Will trial buspirone 5 mg twice daily as needed Support offered

## 2024-09-19 NOTE — Progress Notes (Signed)
 Subjective:    Patient ID: Robin Harris, female    DOB: 2001-04-08, 22 y.o.   MRN: [id number]  HPI  Patient presents to clinic today for her annual exam.  She also reports worsening anxiety.  She reports this has been getting worse over the last year.  She reports she works part-time and is in school as well.  She does not feel depressed.  She is not currently taking medications for this.  She is not currently seeing a therapist.  She denies SI/HI.  Flu: 08/2024 Tetanus: 05/2015 COVID: never Dentist: biannually  Diet: She does eat meat. She consumes fruits and veggies. She does eat some fried foods. She drinks mostly water and tea. Exercise: Walking  Review of Systems     Past Medical History:  Diagnosis Date   ALL (acute lymphoblastic leukemia) (HCC) 2007   semiannually sees Dr. Viviane [facility]), in remission ==> rec yearly CBC, CMP, CPE, and echo Q55yrs - and plz fax to [facility] peds onc (see note dated 10/2012)   Arthritis    pt states she does not have arthritis    Current Outpatient Medications  Medication Sig Dispense Refill   Albuterol -Budesonide  (AIRSUPRA ) 90-80 MCG/ACT AERO Inhale 1 puff into the lungs every 4 (four) hours as needed. 10.7 g 1   benzonatate  (TESSALON ) 100 MG capsule Take 1-2 capsules (100-200 mg total) by mouth 3 (three) times daily as needed. 30 capsule 0   clobetasol  cream (TEMOVATE ) 0.05 % Apply to affected area as directed daily up to 5 days a week to eczema on hands and arms as needed for flares, avoid face, groin, axilla 60 g 2   fluticasone  (FLONASE ) 50 MCG/ACT nasal spray Place 2 sprays into both nostrils daily. 16 g 0   ibuprofen  (ADVIL ) 800 MG tablet Take 1 tablet (800 mg total) by mouth 3 (three) times daily. 90 tablet 5   ipratropium-albuterol  (DUONEB) 0.5-2.5 (3) MG/3ML SOLN Take 3 mLs by nebulization every 4 (four) hours as needed. 360 mL 0   No current facility-administered medications for this visit.    Allergies  Allergen Reactions    Prednisone  Other (See Comments)    GI upset.    Sulfamethoxazole-Trimethoprim Rash    Family History  Problem Relation Age of Onset   Diabetes Maternal Grandmother    Hyperlipidemia Maternal Grandmother    Coronary artery disease Neg Hx    Stroke Neg Hx     Social History   Socioeconomic History   Marital status: Single    Spouse name: Not on file   Number of children: Not on file   Years of education: Not on file   Highest education level: Not on file  Occupational History   Not on file  Tobacco Use   Smoking status: Never    Passive exposure: Never   Smokeless tobacco: Never  Vaping Use   Vaping status: Never Used  Substance and Sexual Activity   Alcohol use: No   Drug use: Never   Sexual activity: Never  Other Topics Concern   Not on file  Social History Narrative   Lives with mom and sister Glenetta).  No pets.   No smokers at home.   Social Drivers of Corporate Investment Banker Strain: Low Risk  (09/19/2023)   Overall Financial Resource Strain (CARDIA)    Difficulty of Paying Living Expenses: Not hard at all  Food Insecurity: No Food Insecurity (09/19/2023)   Hunger Vital Sign    Worried About Running  Out of Food in the Last Year: Never true    Ran Out of Food in the Last Year: Never true  Transportation Needs: No Transportation Needs (09/19/2023)   PRAPARE - Administrator, Civil Service (Medical): No    Lack of Transportation (Non-Medical): No  Physical Activity: Sufficiently Active (09/19/2023)   Exercise Vital Sign    Days of Exercise per Week: 4 days    Minutes of Exercise per Session: 40 min  Stress: No Stress Concern Present (09/19/2023)   [employer] of Occupational Health - Occupational Stress Questionnaire    Feeling of Stress : Not at all  Social Connections: Moderately Integrated (09/19/2023)   Social Connection and Isolation Panel    Frequency of Communication with Friends and Family: More than three times a week     Frequency of Social Gatherings with Friends and Family: More than three times a week    Attends Religious Services: More than 4 times per year    Active Member of [employer] or Organizations: No    Attends Banker Meetings: Never    Marital Status: Living with partner  Intimate Partner Violence: Not At Risk (09/19/2023)   Humiliation, Afraid, Rape, and Kick questionnaire    Fear of Current or Ex-Partner: No    Emotionally Abused: No    Physically Abused: No    Sexually Abused: No     Constitutional: Denies fever, malaise, fatigue, headache or abrupt weight changes.  HEENT: Denies eye pain, eye redness, ear pain, ringing in the ears, wax buildup, runny nose, nasal congestion, bloody nose, or sore throat. Respiratory: Denies difficulty breathing, shortness of breath, cough or sputum production.   Cardiovascular: Denies chest pain, chest tightness, palpitations or swelling in the hands or feet.  Gastrointestinal: Denies abdominal pain, bloating, constipation, diarrhea or blood in the stool.  GU: Denies urgency, frequency, pain with urination, burning sensation, blood in urine, odor or discharge. Musculoskeletal: Denies decrease in range of motion, difficulty with gait, muscle pain or joint pain and swelling.  Skin: Denies redness, rashes, lesions or ulcercations.  Neurological: Denies dizziness, difficulty with memory, difficulty with speech or problems with balance and coordination.  Psych: Pt reports anxiety. Denies depression, SI/HI.  No other specific complaints in a complete review of systems (except as listed in HPI above).  Objective:   Physical Exam  BP 112/72 (BP Location: Left Arm, Patient Position: Sitting, Cuff Size: Normal)   Ht 5' 2 (1.575 m)   Wt 164 lb (74.4 kg)   LMP 09/05/2024 (Approximate)   BMI 30.00 kg/m     Wt Readings from Last 3 Encounters:  03/22/24 168 lb 6.4 oz (76.4 kg)  10/19/23 167 lb (75.8 kg)  10/04/23 166 lb 12.8 oz (75.7 kg) (90%, Z=  1.30)*   * Growth percentiles are based on CDC (Girls, 2-20 Years) data.    General: Appears her stated age, obese, in NAD. Skin: Warm, dry and intact.  HEENT: Head: normal shape and size; Eyes: sclera white, no icterus, conjunctiva pink, PERRLA and EOMs intact;  Neck:  Neck supple, trachea midline. No masses, lumps or thyromegaly present.  Cardiovascular: Normal rate and rhythm. S1,S2 noted.  No murmur, rubs or gallops noted. No JVD or BLE edema.  Pulmonary/Chest: Normal effort and positive vesicular breath sounds. No respiratory distress. No wheezes, rales or ronchi noted.  Abdomen:  Normal bowel sounds.  Musculoskeletal: Strength 5/5 BUE/BLE. No difficulty with gait.  Neurological: Alert and oriented. Cranial nerves II-XII grossly intact.  Coordination normal.  Psychiatric: Mood and affect normal. Behavior is normal. Judgment and thought content normal.    BMET    Component Value Date/Time   NA 136 10/02/2023 2124   K 3.6 10/02/2023 2124   CL 103 10/02/2023 2124   CO2 21 (L) 10/02/2023 2124   GLUCOSE 89 10/02/2023 2124   BUN 13 10/02/2023 2124   CREATININE 0.83 10/02/2023 2124   CREATININE 0.88 09/19/2023 0855   CALCIUM 9.5 10/02/2023 2124   GFRNONAA >60 10/02/2023 2124    Lipid Panel     Component Value Date/Time   CHOL 192 03/22/2024 0855   TRIG 88 03/22/2024 0855   HDL 48 (L) 03/22/2024 0855   CHOLHDL 4.0 03/22/2024 0855   LDLCALC 125 (H) 03/22/2024 0855    CBC    Component Value Date/Time   WBC 6.2 10/04/2023 1315   RBC 4.28 10/04/2023 1315   HGB 13.2 10/04/2023 1315   HCT 40.6 10/04/2023 1315   PLT 223 10/04/2023 1315   MCV 94.9 10/04/2023 1315   MCH 30.8 10/04/2023 1315   MCHC 32.5 10/04/2023 1315   RDW 11.8 10/04/2023 1315   LYMPHSABS 0.9 10/02/2023 2124   MONOABS 1.0 10/02/2023 2124   EOSABS 0.3 10/02/2023 2124   BASOSABS 0.1 10/02/2023 2124    Hgb A1C Lab Results  Component Value Date   HGBA1C 5.2 09/19/2023           Assessment &  Plan:   Preventative Health Maintenance:  Flu shot UTD Tetanus UTD Encouraged her to get her COVID-vaccine Encouraged her to consume a balanced diet and exercise regimen Advised her to see a dentist annually Will check CBC, CMET, TSH, Lipid, A1C today   RTC in 1 year, sooner if needed Angeline Laura, NP

## 2024-09-19 NOTE — Patient Instructions (Signed)

## 2024-09-20 ENCOUNTER — Ambulatory Visit: Payer: Self-pay | Admitting: Internal Medicine

## 2024-09-20 LAB — COMPREHENSIVE METABOLIC PANEL WITH GFR
AG Ratio: 1.8 (calc) (ref 1.0–2.5)
ALT: 11 U/L (ref 6–29)
AST: 12 U/L (ref 10–30)
Albumin: 4.6 g/dL (ref 3.6–5.1)
Alkaline phosphatase (APISO): 64 U/L (ref 31–125)
BUN: 7 mg/dL (ref 7–25)
CO2: 25 mmol/L (ref 20–32)
Calcium: 9.7 mg/dL (ref 8.6–10.2)
Chloride: 106 mmol/L (ref 98–110)
Creat: 0.71 mg/dL (ref 0.50–0.96)
Globulin: 2.6 g/dL (ref 1.9–3.7)
Glucose, Bld: 70 mg/dL (ref 65–99)
Potassium: 4.5 mmol/L (ref 3.5–5.3)
Sodium: 140 mmol/L (ref 135–146)
Total Bilirubin: 0.4 mg/dL (ref 0.2–1.2)
Total Protein: 7.2 g/dL (ref 6.1–8.1)
eGFR: 125 mL/min/1.73m2 (ref >=60)

## 2024-09-20 LAB — CBC
HCT: 42.2 % (ref 35.0–45.0)
Hemoglobin: 13.6 g/dL (ref 11.7–15.5)
MCH: 31.2 pg (ref 27.0–33.0)
MCHC: 32.2 g/dL (ref 32.0–36.0)
MCV: 96.8 fL (ref 80.0–100.0)
MPV: 11.6 fL (ref 7.5–12.5)
Platelets: 217 Thousand/uL (ref 140–400)
RBC: 4.36 Million/uL (ref 3.80–5.10)
RDW: 11.9 % (ref 11.0–15.0)
WBC: 5.9 Thousand/uL (ref 3.8–10.8)

## 2024-09-20 LAB — LIPID PANEL
Cholesterol: 172 mg/dL (ref <200)
HDL: 45 mg/dL — ABNORMAL LOW (ref >=50)
LDL Cholesterol (Calc): 111 mg/dL — ABNORMAL HIGH
Non-HDL Cholesterol (Calc): 127 mg/dL (ref <130)
Total CHOL/HDL Ratio: 3.8 (calc) (ref <5.0)
Triglycerides: 74 mg/dL (ref <150)

## 2024-09-20 LAB — HEMOGLOBIN A1C
Hgb A1c MFr Bld: 5 % (ref <5.7)
Mean Plasma Glucose: 97 mg/dL
eAG (mmol/L): 5.4 mmol/L

## 2024-09-20 LAB — TSH: TSH: 0.61 m[IU]/L

## 2024-10-30 ENCOUNTER — Ambulatory Visit (INDEPENDENT_AMBULATORY_CARE_PROVIDER_SITE_OTHER)

## 2024-10-30 DIAGNOSIS — Z23 Encounter for immunization: Secondary | ICD-10-CM

## 2025-09-20 ENCOUNTER — Encounter: Admitting: Internal Medicine
